# Patient Record
Sex: Male | Born: 1990 | Race: Black or African American | Hispanic: No | Marital: Single | Smoking: Current every day smoker
Health system: Southern US, Community
[De-identification: ages and names within clinical notes are randomized; demographics above are authoritative.]

## PROBLEM LIST (undated history)

## (undated) DIAGNOSIS — H3320 Serous retinal detachment, unspecified eye: Secondary | ICD-10-CM

## (undated) DIAGNOSIS — E669 Obesity, unspecified: Secondary | ICD-10-CM

## (undated) DIAGNOSIS — J45909 Unspecified asthma, uncomplicated: Secondary | ICD-10-CM

## (undated) DIAGNOSIS — F329 Major depressive disorder, single episode, unspecified: Secondary | ICD-10-CM

## (undated) DIAGNOSIS — J40 Bronchitis, not specified as acute or chronic: Secondary | ICD-10-CM

## (undated) DIAGNOSIS — B019 Varicella without complication: Secondary | ICD-10-CM

## (undated) DIAGNOSIS — T300 Burn of unspecified body region, unspecified degree: Secondary | ICD-10-CM

## (undated) DIAGNOSIS — F419 Anxiety disorder, unspecified: Secondary | ICD-10-CM

## (undated) DIAGNOSIS — F32A Depression, unspecified: Secondary | ICD-10-CM

## (undated) HISTORY — DX: Obesity, unspecified: E66.9

## (undated) HISTORY — DX: Serous retinal detachment, unspecified eye: H33.20

## (undated) HISTORY — DX: Anxiety disorder, unspecified: F41.9

## (undated) HISTORY — DX: Burn of unspecified body region, unspecified degree: T30.0

## (undated) HISTORY — DX: Major depressive disorder, single episode, unspecified: F32.9

## (undated) HISTORY — DX: Unspecified asthma, uncomplicated: J45.909

## (undated) HISTORY — DX: Depression, unspecified: F32.A

## (undated) HISTORY — DX: Varicella without complication: B01.9

---
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
## 2010-03-24 LAB — D-DIMER, QUANTITATIVE: D-Dimer, Quant: <0.22 ug/mL-FEU (ref 0.00–0.48)

## 2010-07-04 ENCOUNTER — Ambulatory Visit: Payer: Self-pay | Admitting: Medical

## 2010-08-04 ENCOUNTER — Encounter: Payer: Self-pay | Admitting: Medical

## 2010-08-04 ENCOUNTER — Encounter: Payer: Self-pay | Admitting: Family Medicine

## 2010-08-04 ENCOUNTER — Ambulatory Visit (INDEPENDENT_AMBULATORY_CARE_PROVIDER_SITE_OTHER): Payer: Federal, State, Local not specified - PPO | Admitting: Medical

## 2010-08-04 VITALS — BP 140/84 | HR 68 | Temp 98.1°F | Ht 72.0 in | Wt 206.0 lb

## 2010-08-04 DIAGNOSIS — F39 Unspecified mood [affective] disorder: Secondary | ICD-10-CM

## 2010-08-04 DIAGNOSIS — F329 Major depressive disorder, single episode, unspecified: Secondary | ICD-10-CM

## 2010-08-04 DIAGNOSIS — Z202 Contact with and (suspected) exposure to infections with a predominantly sexual mode of transmission: Secondary | ICD-10-CM

## 2010-08-04 DIAGNOSIS — R5383 Other fatigue: Secondary | ICD-10-CM

## 2010-08-04 DIAGNOSIS — R05 Cough: Secondary | ICD-10-CM

## 2010-08-04 LAB — CBC WITH DIFFERENTIAL/PLATELET
Eosinophils Relative: 1 % (ref 0–5)
HCT: 44.8 % (ref 39.0–52.0)
Hemoglobin: 15.1 g/dL (ref 13.0–17.0)
Lymphocytes Relative: 34 % (ref 12–46)
Lymphs Abs: 2.8 10*3/uL (ref 0.7–4.0)
MCV: 84.8 fL (ref 78.0–100.0)
Monocytes Absolute: 0.5 10*3/uL (ref 0.1–1.0)
Monocytes Relative: 6 % (ref 3–12)
Neutro Abs: 4.7 10*3/uL (ref 1.7–7.7)
WBC: 8.2 10*3/uL (ref 4.0–10.5)

## 2010-08-04 LAB — COMPREHENSIVE METABOLIC PANEL
AST: 18 U/L (ref 0–37)
Albumin: 4.2 g/dL (ref 3.5–5.2)
BUN: 10 mg/dL (ref 6–23)
CO2: 24 mEq/L (ref 19–32)
Calcium: 9.5 mg/dL (ref 8.4–10.5)
Chloride: 106 mEq/L (ref 96–112)
Glucose, Bld: 90 mg/dL (ref 70–99)
Potassium: 3.9 mEq/L (ref 3.5–5.3)

## 2010-08-04 MED ORDER — RISPERIDONE 0.5 MG PO TABS: 0.5000 mg | ORAL_TABLET | Freq: Two times a day (BID) | ORAL | Status: AC

## 2010-08-04 NOTE — Patient Instructions (Signed)
Recheck in 2 weeks.  Call sooner if needed.
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
## 2010-08-05 ENCOUNTER — Telehealth: Payer: Self-pay | Admitting: Medical

## 2010-08-05 LAB — GC/CHLAMYDIA PROBE AMP, URINE: GC Probe Amp, Urine: NEGATIVE

## 2010-08-05 NOTE — Telephone Encounter (Signed)
Pt has called twice and would like results.  Please review labs and let me know.  CM, LPN

## 2010-08-05 NOTE — Telephone Encounter (Signed)
All labs are normal. 

## 2010-08-05 NOTE — Telephone Encounter (Signed)
Pt notified that all lab results were normal.  Faxed GC and Chlamydia results to health department in [facility] to [facility].  CM, LPN

## 2010-08-11 ENCOUNTER — Telehealth: Payer: Self-pay | Admitting: *Deleted

## 2010-08-11 NOTE — Telephone Encounter (Addendum)
Message copied by Dorthula Perfect on Thu Aug 11, 2010  5:02 PM ------      Message from: Aleen Campi, DAVID S      Created: Thu Aug 11, 2010 12:45 PM       Liver, kidney, electrolytes, blood counts, thyroid labs ALL normal.   His STD screening ALL normal for HIV, syphilis, gonorrhea, chlamydia.  Pls send copy of the STD labs to health dept in [place] - see chart notes for address/contact info.  He left a business card with this info too.  Have him return in 2wk f/u on medication as discussed.    Pt informed of all labs and have faxed STD screening to Health Depart. In [place].  Pt scheduled to come in on 08-17-10 at 10am.  CM,LPN

## 2010-08-17 ENCOUNTER — Ambulatory Visit: Payer: Federal, State, Local not specified - PPO | Admitting: Medical

## 2010-10-14 ENCOUNTER — Other Ambulatory Visit (INDEPENDENT_AMBULATORY_CARE_PROVIDER_SITE_OTHER): Payer: Federal, State, Local not specified - PPO

## 2010-10-14 DIAGNOSIS — Z23 Encounter for immunization: Secondary | ICD-10-CM

## 2010-11-03 ENCOUNTER — Ambulatory Visit: Payer: Federal, State, Local not specified - PPO | Admitting: Family Medicine

## 2010-11-04 ENCOUNTER — Emergency Department (HOSPITAL_COMMUNITY)
Admission: EM | Admit: 2010-11-04 | Discharge: 2010-11-05 | Disposition: A | Discharge status: Home / Self Care | Payer: Federal, State, Local not specified - PPO | Attending: Emergency Medicine | Admitting: Emergency Medicine

## 2010-11-04 DIAGNOSIS — R05 Cough: Secondary | ICD-10-CM | POA: Insufficient documentation

## 2010-11-04 DIAGNOSIS — R059 Cough, unspecified: Secondary | ICD-10-CM | POA: Insufficient documentation

## 2010-11-04 DIAGNOSIS — J020 Streptococcal pharyngitis: Secondary | ICD-10-CM | POA: Insufficient documentation

## 2010-11-04 DIAGNOSIS — F17200 Nicotine dependence, unspecified, uncomplicated: Secondary | ICD-10-CM | POA: Insufficient documentation

## 2010-11-04 DIAGNOSIS — R6883 Chills (without fever): Secondary | ICD-10-CM | POA: Insufficient documentation

## 2011-02-22 ENCOUNTER — Ambulatory Visit (INDEPENDENT_AMBULATORY_CARE_PROVIDER_SITE_OTHER): Payer: Federal, State, Local not specified - PPO | Admitting: Family Medicine

## 2011-02-22 ENCOUNTER — Encounter: Payer: Self-pay | Admitting: Family Medicine

## 2011-02-22 VITALS — BP 102/70 | HR 80 | Ht 72.0 in | Wt 224.0 lb

## 2011-02-22 DIAGNOSIS — L0100 Impetigo, unspecified: Secondary | ICD-10-CM

## 2011-02-22 MED ORDER — DOXYCYCLINE HYCLATE 100 MG PO TABS: 100.0000 mg | ORAL_TABLET | Freq: Two times a day (BID) | ORAL | Status: AC

## 2011-02-22 NOTE — Progress Notes (Signed)
  Subjective:    Patient ID: Dustin Day, male    DOB: 07-26-1990, 21 y.o.   MRN: [id number]  HPI He is here for evaluation of multiple scattered skin lesions. In the past he had apparently been seen for this and treated with an antibiotic however he ran out of this medication.   Review of Systems     Objective:   Physical Exam Exam of his skin shows scattered healing lesions mainly on his arms. They're very superficial and slightly scaly.       Assessment & Plan:   1. Impetigo any site    I will place him on doxycycline. He is also to use Dial soap or Lever 2000. He will call if he has further trouble

## 2011-02-22 NOTE — Patient Instructions (Signed)
Take all the antibiotic and have none left over. use Dial soap or Lever 2000

## 2011-03-22 ENCOUNTER — Ambulatory Visit (INDEPENDENT_AMBULATORY_CARE_PROVIDER_SITE_OTHER): Payer: Federal, State, Local not specified - PPO | Admitting: Medical

## 2011-03-22 ENCOUNTER — Encounter: Payer: Self-pay | Admitting: Medical

## 2011-03-22 VITALS — BP 110/70 | HR 60 | Temp 98.1°F | Resp 16 | Wt 222.0 lb

## 2011-03-22 DIAGNOSIS — Q809 Congenital ichthyosis, unspecified: Secondary | ICD-10-CM

## 2011-03-22 DIAGNOSIS — Q828 Other specified congenital malformations of skin: Secondary | ICD-10-CM

## 2011-03-22 DIAGNOSIS — L209 Atopic dermatitis, unspecified: Secondary | ICD-10-CM

## 2011-03-22 DIAGNOSIS — L2089 Other atopic dermatitis: Secondary | ICD-10-CM

## 2011-03-22 MED ORDER — TRIAMCINOLONE ACETONIDE 0.1 % EX CREA: TOPICAL_CREAM | Freq: Two times a day (BID) | CUTANEOUS | Status: DC
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
## 2011-03-22 NOTE — Patient Instructions (Signed)
Your skin today suggests eczema flare up.     I want you to keep your skin moist in general with drinking plenty of water, using a good moisturizing lotion once to twice daily at least, with Eucerin or Lubriderm.  For the next few days, in addition to moisturizing lotion, begin the Triamcinolone cream to the itchy rough areas that are the worst.   Also, since eczema is related to allergies, and you have a history of allergies in the spring, go ahead and begin back on Zyrtec 10mg  daily OTC for the next 1- 2 months.   Other measures - avoid really hot showers, consider using Dove Sensitive Skin soap, you can also do some oatmeal baths.   Eczema Atopic dermatitis, or eczema, is an inherited type of sensitive skin. Often people with eczema have a family history of allergies, asthma, or hay fever. It causes a red itchy rash and dry scaly skin. The itchiness may occur before the skin rash and may be very intense. It is not contagious. Eczema is generally worse during the cooler winter months and often improves with the warmth of summer. Eczema usually starts showing signs in infancy. Some children outgrow eczema, but it may last through adulthood. Flare-ups may be caused by:  Eating something or contact with something you are sensitive or allergic to.   Stress.  DIAGNOSIS  The diagnosis of eczema is usually based upon symptoms and medical history. TREATMENT  Eczema cannot be cured, but symptoms usually can be controlled with treatment or avoidance of allergens (things to which you are sensitive or allergic to).  Controlling the itching and scratching.   Use over-the-counter antihistamines as directed for itching. It is especially useful at night when the itching tends to be worse.   Use over-the-counter steroid creams as directed for itching.   Scratching makes the rash and itching worse and may cause impetigo (a skin infection) if fingernails are contaminated (dirty).   Keeping the skin well  moisturized with creams every day. This will seal in moisture and help prevent dryness. Lotions containing alcohol and water can dry the skin and are not recommended.   Limiting exposure to allergens.   Recognizing situations that cause stress.   Developing a plan to manage stress.  HOME CARE INSTRUCTIONS   Take prescription and over-the-counter medicines as directed by your caregiver.   Do not use anything on the skin without checking with your caregiver.   Keep baths or showers short (5 minutes) in warm (not hot) water. Use mild cleansers for bathing. You may add non-perfumed bath oil to the bath water. It is best to avoid soap and bubble bath.   Immediately after a bath or shower, when the skin is still damp, apply a moisturizing ointment to the entire body. This ointment should be a petroleum ointment. This will seal in moisture and help prevent dryness. The thicker the ointment the better. These should be unscented.   Keep fingernails cut short and wash hands often. If your child has eczema, it may be necessary to put soft gloves or mittens on your child at night.   Dress in clothes made of cotton or cotton blends. Dress lightly, as heat increases itching.   Avoid foods that may cause flare-ups. Common foods include cow's milk, peanut butter, eggs and wheat.   Keep a child with eczema away from anyone with fever blisters. The virus that causes fever blisters (herpes simplex) can cause a serious skin infection in children with eczema.  SEEK MEDICAL CARE IF:   Itching interferes with sleep.   The rash gets worse or is not better within one week following treatment.   The rash looks infected (pus or soft yellow scabs).   You or your child has an oral temperature above 102 F (38.9 C).   Your baby is older than 3 months with a rectal temperature of 100.5 F (38.1 C) or higher for more than 1 day.   The rash flares up after contact with someone who has fever blisters.  SEEK  IMMEDIATE MEDICAL CARE IF:   Your baby is older than 3 months with a rectal temperature of 102 F (38.9 C) or higher.   Your baby is older than 3 months or younger with a rectal temperature of 100.4 F (38 C) or higher.  Document Released: 12/24/1999 Document Revised: 12/15/2010 Document Reviewed: 10/28/2008 [facility] Patient Information 2012 [place], [place].

## 2011-10-11 ENCOUNTER — Encounter: Payer: Self-pay | Admitting: Internal Medicine

## 2011-10-19 ENCOUNTER — Encounter: Payer: Self-pay | Admitting: Family Medicine

## 2011-10-19 ENCOUNTER — Ambulatory Visit (INDEPENDENT_AMBULATORY_CARE_PROVIDER_SITE_OTHER): Payer: Federal, State, Local not specified - PPO | Admitting: Family Medicine

## 2011-10-19 VITALS — BP 130/80 | HR 102

## 2011-10-19 DIAGNOSIS — Z0000 Encounter for general adult medical examination without abnormal findings: Secondary | ICD-10-CM

## 2011-10-19 DIAGNOSIS — Z209 Contact with and (suspected) exposure to unspecified communicable disease: Secondary | ICD-10-CM

## 2011-10-19 DIAGNOSIS — Z23 Encounter for immunization: Secondary | ICD-10-CM
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
## 2011-10-20 LAB — GC/CHLAMYDIA PROBE AMP, URINE
Chlamydia, Swab/Urine, PCR: NEGATIVE
GC Probe Amp, Urine: NEGATIVE

## 2011-10-20 LAB — RPR

## 2011-10-20 NOTE — Progress Notes (Signed)
Quick Note:  Pt called and and message left labs look great all negative ______

## 2012-03-13 ENCOUNTER — Encounter: Payer: Self-pay | Admitting: Family Medicine

## 2012-03-13 ENCOUNTER — Ambulatory Visit (INDEPENDENT_AMBULATORY_CARE_PROVIDER_SITE_OTHER): Payer: Federal, State, Local not specified - PPO | Admitting: Family Medicine

## 2012-03-13 VITALS — BP 130/88 | HR 101 | Wt 228.0 lb

## 2012-03-13 DIAGNOSIS — L678 Other hair color and hair shaft abnormalities: Secondary | ICD-10-CM

## 2012-03-13 DIAGNOSIS — Z209 Contact with and (suspected) exposure to unspecified communicable disease: Secondary | ICD-10-CM

## 2012-03-13 DIAGNOSIS — L738 Other specified follicular disorders: Secondary | ICD-10-CM

## 2012-03-13 DIAGNOSIS — L739 Follicular disorder, unspecified: Secondary | ICD-10-CM

## 2012-03-13 MED ORDER — DOXYCYCLINE HYCLATE 100 MG PO TABS: 100.0000 mg | ORAL_TABLET | Freq: Two times a day (BID) | ORAL | Status: DC

## 2012-03-13 MED ORDER — TRIAMCINOLONE ACETONIDE 0.1 % EX CREA: TOPICAL_CREAM | Freq: Two times a day (BID) | CUTANEOUS | Status: DC
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
## 2012-03-14 LAB — GC/CHLAMYDIA PROBE AMP, URINE: GC Probe Amp, Urine: NEGATIVE

## 2012-03-14 NOTE — Progress Notes (Signed)
Quick Note:  Called pt to inform him labs are neg left message ______

## 2012-03-16 LAB — CULTURE, ROUTINE-ABSCESS

## 2012-03-30 ENCOUNTER — Emergency Department (HOSPITAL_COMMUNITY)
Admission: EM | Admit: 2012-03-30 | Discharge: 2012-03-30 | Disposition: A | Discharge status: Home / Self Care | Payer: Federal, State, Local not specified - PPO | Attending: Emergency Medicine | Admitting: Emergency Medicine

## 2012-03-30 ENCOUNTER — Emergency Department (HOSPITAL_COMMUNITY): Payer: Federal, State, Local not specified - PPO

## 2012-03-30 ENCOUNTER — Encounter (HOSPITAL_COMMUNITY): Payer: Self-pay | Admitting: *Deleted

## 2012-03-30 DIAGNOSIS — Z87828 Personal history of other (healed) physical injury and trauma: Secondary | ICD-10-CM | POA: Insufficient documentation

## 2012-03-30 DIAGNOSIS — Z8614 Personal history of Methicillin resistant Staphylococcus aureus infection: Secondary | ICD-10-CM | POA: Insufficient documentation

## 2012-03-30 DIAGNOSIS — Z8669 Personal history of other diseases of the nervous system and sense organs: Secondary | ICD-10-CM | POA: Insufficient documentation

## 2012-03-30 DIAGNOSIS — E669 Obesity, unspecified: Secondary | ICD-10-CM | POA: Insufficient documentation

## 2012-03-30 DIAGNOSIS — R079 Chest pain, unspecified: Secondary | ICD-10-CM | POA: Insufficient documentation

## 2012-03-30 DIAGNOSIS — IMO0002 Reserved for concepts with insufficient information to code with codable children: Secondary | ICD-10-CM | POA: Insufficient documentation

## 2012-03-30 DIAGNOSIS — R6883 Chills (without fever): Secondary | ICD-10-CM | POA: Insufficient documentation

## 2012-03-30 DIAGNOSIS — R0602 Shortness of breath: Secondary | ICD-10-CM | POA: Insufficient documentation

## 2012-03-30 DIAGNOSIS — R197 Diarrhea, unspecified: Secondary | ICD-10-CM | POA: Insufficient documentation

## 2012-03-30 DIAGNOSIS — IMO0001 Reserved for inherently not codable concepts without codable children: Secondary | ICD-10-CM | POA: Insufficient documentation

## 2012-03-30 DIAGNOSIS — J029 Acute pharyngitis, unspecified: Secondary | ICD-10-CM | POA: Insufficient documentation

## 2012-03-30 DIAGNOSIS — R0989 Other specified symptoms and signs involving the circulatory and respiratory systems: Secondary | ICD-10-CM | POA: Insufficient documentation

## 2012-03-30 DIAGNOSIS — F17200 Nicotine dependence, unspecified, uncomplicated: Secondary | ICD-10-CM | POA: Insufficient documentation

## 2012-03-30 DIAGNOSIS — J40 Bronchitis, not specified as acute or chronic: Secondary | ICD-10-CM | POA: Insufficient documentation

## 2012-03-30 DIAGNOSIS — J3489 Other specified disorders of nose and nasal sinuses: Secondary | ICD-10-CM | POA: Insufficient documentation

## 2012-03-30 DIAGNOSIS — R61 Generalized hyperhidrosis: Secondary | ICD-10-CM | POA: Insufficient documentation

## 2012-03-30 HISTORY — DX: Bronchitis, not specified as acute or chronic: J40

## 2012-03-30 MED ORDER — BENZONATATE 100 MG PO CAPS: 100.0000 mg | ORAL_CAPSULE | Freq: Three times a day (TID) | ORAL | Status: DC

## 2012-03-30 MED ORDER — ALBUTEROL SULFATE HFA 108 (90 BASE) MCG/ACT IN AERS
2.0000 | INHALATION_SPRAY | Freq: Once | RESPIRATORY_TRACT | Status: AC
  Administered 2012-03-30: 2 via RESPIRATORY_TRACT
  Filled 2012-03-30: qty 6.7
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
## 2012-03-30 NOTE — ED Notes (Addendum)
Pt states he has had a cold/cough and nasal congestion x2 weeks.  Pt reports subjective fever.  Pt endorses occasional diarrhea over past 2 weeks, but denies N/V.  Pt states this morning he developed chest pain with inspiration/expiration and movement.  Pt's lung sounds are clear.

## 2012-04-01 NOTE — ED Provider Notes (Signed)
Medical screening examination/treatment/procedure(s) were performed by non-physician practitioner and as supervising physician I was immediately available for consultation/collaboration.   Daisuke Bailey L Gioia Ranes, MD 04/01/12 0554 

## 2012-06-23 ENCOUNTER — Emergency Department (HOSPITAL_COMMUNITY)
Admission: EM | Admit: 2012-06-23 | Discharge: 2012-06-23 | Disposition: A | Discharge status: Home / Self Care | Payer: Federal, State, Local not specified - PPO | Attending: Emergency Medicine | Admitting: Emergency Medicine

## 2012-06-23 ENCOUNTER — Encounter (HOSPITAL_COMMUNITY): Payer: Self-pay | Admitting: *Deleted

## 2012-06-23 ENCOUNTER — Emergency Department (HOSPITAL_COMMUNITY): Payer: Federal, State, Local not specified - PPO

## 2012-06-23 DIAGNOSIS — Z8709 Personal history of other diseases of the respiratory system: Secondary | ICD-10-CM | POA: Insufficient documentation

## 2012-06-23 DIAGNOSIS — X500XXA Overexertion from strenuous movement or load, initial encounter: Secondary | ICD-10-CM | POA: Insufficient documentation

## 2012-06-23 DIAGNOSIS — Y9389 Activity, other specified: Secondary | ICD-10-CM | POA: Insufficient documentation

## 2012-06-23 DIAGNOSIS — Z87828 Personal history of other (healed) physical injury and trauma: Secondary | ICD-10-CM | POA: Insufficient documentation

## 2012-06-23 DIAGNOSIS — E669 Obesity, unspecified: Secondary | ICD-10-CM | POA: Insufficient documentation

## 2012-06-23 DIAGNOSIS — S93401A Sprain of unspecified ligament of right ankle, initial encounter: Secondary | ICD-10-CM

## 2012-06-23 DIAGNOSIS — F17200 Nicotine dependence, unspecified, uncomplicated: Secondary | ICD-10-CM | POA: Insufficient documentation

## 2012-06-23 DIAGNOSIS — S93601A Unspecified sprain of right foot, initial encounter: Secondary | ICD-10-CM

## 2012-06-23 DIAGNOSIS — S93409A Sprain of unspecified ligament of unspecified ankle, initial encounter: Secondary | ICD-10-CM | POA: Insufficient documentation

## 2012-06-23 DIAGNOSIS — Z8669 Personal history of other diseases of the nervous system and sense organs: Secondary | ICD-10-CM | POA: Insufficient documentation

## 2012-06-23 DIAGNOSIS — Y9289 Other specified places as the place of occurrence of the external cause: Secondary | ICD-10-CM | POA: Insufficient documentation

## 2012-06-23 MED ORDER — OXYCODONE-ACETAMINOPHEN 5-325 MG PO TABS
1.0000 | ORAL_TABLET | Freq: Once | ORAL | Status: AC
  Administered 2012-06-23: 1 via ORAL
  Filled 2012-06-23: qty 1

## 2012-06-23 MED ORDER — OXYCODONE-ACETAMINOPHEN 5-325 MG PO TABS: 1.0000 | ORAL_TABLET | ORAL | Status: DC | PRN

## 2012-06-23 MED ORDER — IBUPROFEN 800 MG PO TABS
800.0000 mg | ORAL_TABLET | Freq: Once | ORAL | Status: AC
  Administered 2012-06-23: 800 mg via ORAL
  Filled 2012-06-23: qty 1

## 2012-06-23 MED ORDER — IBUPROFEN 800 MG PO TABS: 800.0000 mg | ORAL_TABLET | Freq: Three times a day (TID) | ORAL | Status: DC

## 2012-06-23 NOTE — Progress Notes (Signed)
Orthopedic Tech Progress Note Patient Details:  Dustin Day 04-Apr-1990 [id number]  Ortho Devices Type of Ortho Device: ASO;Crutches Ortho Device/Splint Location: RLE Ortho Device/Splint Interventions: Ordered;Application   Jennye Moccasin 06/23/2012, 11:02 PM
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
## 2012-06-23 NOTE — Discharge Instructions (Signed)
Ankle Sprain °An ankle sprain is an injury to the strong, fibrous tissues (ligaments) that hold the bones of your ankle joint together.  °CAUSES °An ankle sprain is usually caused by a fall or by twisting your ankle. Ankle sprains most commonly occur when you step on the outer edge of your foot, and your ankle turns inward. People who participate in sports are more prone to these types of injuries.  °SYMPTOMS  °· Pain in your ankle. The pain may be present at rest or only when you are trying to stand or walk. °· Swelling. °· Bruising. Bruising may develop immediately or within 1 to 2 days after your injury. °· Difficulty standing or walking, particularly when turning corners or changing directions. °DIAGNOSIS  °Your caregiver will ask you details about your injury and perform a physical exam of your ankle to determine if you have an ankle sprain. During the physical exam, your caregiver will press on and apply pressure to specific areas of your foot and ankle. Your caregiver will try to move your ankle in certain ways. An X-ray exam may be done to be sure a bone was not broken or a ligament did not separate from one of the bones in your ankle (avulsion fracture).  °TREATMENT  °Certain types of braces can help stabilize your ankle. Your caregiver can make a recommendation for this. Your caregiver may recommend the use of medicine for pain. If your sprain is severe, your caregiver may refer you to a surgeon who helps to restore function to parts of your skeletal system (orthopedist) or a physical therapist. °HOME CARE INSTRUCTIONS  °· Apply ice to your injury for 1 2 days or as directed by your caregiver. Applying ice helps to reduce inflammation and pain. °· Put ice in a plastic bag. °· Place a towel between your skin and the bag. °· Leave the ice on for 15-20 minutes at a time, every 2 hours while you are awake. °· Only take over-the-counter or prescription medicines for pain, discomfort, or fever as directed by  your caregiver. °· Elevate your injured ankle above the level of your heart as much as possible for 2 3 days. °· If your caregiver recommends crutches, use them as instructed. Gradually put weight on the affected ankle. Continue to use crutches or a cane until you can walk without feeling pain in your ankle. °· If you have a plaster splint, wear the splint as directed by your caregiver. Do not rest it on anything harder than a pillow for the first 24 hours. Do not put weight on it. Do not get it wet. You may take it off to take a shower or bath. °· You may have been given an elastic bandage to wear around your ankle to provide support. If the elastic bandage is too tight (you have numbness or tingling in your foot or your foot becomes cold and blue), adjust the bandage to make it comfortable. °· If you have an air splint, you may blow more air into it or let air out to make it more comfortable. You may take your splint off at night and before taking a shower or bath. Wiggle your toes in the splint several times per day to decrease swelling. °SEEK MEDICAL CARE IF:  °· You have rapidly increasing bruising or swelling. °· Your toes feel extremely cold or you lose feeling in your foot. °· Your pain is not relieved with medicine. °SEEK IMMEDIATE MEDICAL CARE IF: °· Your toes are numb   or blue. °· You have severe pain that is increasing. °MAKE SURE YOU:  °· Understand these instructions. °· Will watch your condition. °· Will get help right away if you are not doing well or get worse. °Document Released: 12/26/2004 Document Revised: 09/20/2011 Document Reviewed: 01/07/2011 °[employer]® Patient Information ©2014 [employer]. ° °Cryotherapy °Cryotherapy means treatment with cold. Ice or gel packs can be used to reduce both pain and swelling. Ice is the most helpful within the first 24 to 48 hours after an injury or flareup from overusing a muscle or joint. Sprains, strains, spasms, burning pain, shooting pain, and aches can  all be eased with ice. Ice can also be used when recovering from surgery. Ice is effective, has very few side effects, and is safe for most people to use. °PRECAUTIONS  °Ice is not a safe treatment option for people with: °· Raynaud's phenomenon. This is a condition affecting small blood vessels in the extremities. Exposure to cold may cause your problems to return. °· Cold hypersensitivity. There are many forms of cold hypersensitivity, including: °· Cold urticaria. Red, itchy hives appear on the skin when the tissues begin to warm after being iced. °· Cold erythema. This is a red, itchy rash caused by exposure to cold. °· Cold hemoglobinuria. Red blood cells break down when the tissues begin to warm after being iced. The hemoglobin that carry oxygen are passed into the urine because they cannot combine with blood proteins fast enough. °· Numbness or altered sensitivity in the area being iced. °If you have any of the following conditions, do not use ice until you have discussed cryotherapy with your caregiver: °· Heart conditions, such as arrhythmia, angina, or chronic heart disease. °· High blood pressure. °· Healing wounds or open skin in the area being iced. °· Current infections. °· Rheumatoid arthritis. °· Poor circulation. °· Diabetes. °Ice slows the blood flow in the region it is applied. This is beneficial when trying to stop inflamed tissues from spreading irritating chemicals to surrounding tissues. However, if you expose your skin to cold temperatures for too long or without the proper protection, you can damage your skin or nerves. Watch for signs of skin damage due to cold. °HOME CARE INSTRUCTIONS °Follow these tips to use ice and cold packs safely. °· Place a dry or damp towel between the ice and skin. A damp towel will cool the skin more quickly, so you may need to shorten the time that the ice is used. °· For a more rapid response, add gentle compression to the ice. °· Ice for no more than 10 to 20  minutes at a time. The bonier the area you are icing, the less time it will take to get the benefits of ice. °· Check your skin after 5 minutes to make sure there are no signs of a poor response to cold or skin damage. °· Rest 20 minutes or more in between uses. °· Once your skin is numb, you can end your treatment. You can test numbness by very lightly touching your skin. The touch should be so light that you do not see the skin dimple from the pressure of your fingertip. When using ice, most people will feel these normal sensations in this order: cold, burning, aching, and numbness. °· Do not use ice on someone who cannot communicate their responses to pain, such as small children or people with dementia. °HOW TO MAKE AN ICE PACK °Ice packs are the most common way to use ice   therapy. Other methods include ice massage, ice baths, and cryo-sprays. Muscle creams that cause a cold, tingly feeling do not offer the same benefits that ice offers and should not be used as a substitute unless recommended by your caregiver. To make an ice pack, do one of the following:  Place crushed ice or a bag of frozen vegetables in a sealable plastic bag. Squeeze out the excess air. Place this bag inside another plastic bag. Slide the bag into a pillowcase or place a damp towel between your skin and the bag.  Mix 3 parts water with 1 part rubbing alcohol. Freeze the mixture in a sealable plastic bag. When you remove the mixture from the freezer, it will be slushy. Squeeze out the excess air. Place this bag inside another plastic bag. Slide the bag into a pillowcase or place a damp towel between your skin and the bag. SEEK MEDICAL CARE IF:  You develop white spots on your skin. This may give the skin a blotchy (mottled) appearance.  Your skin turns blue or pale.  Your skin becomes waxy or hard.  Your swelling gets worse. MAKE SURE YOU:   Understand these instructions.  Will watch your condition.  Will get help right  away if you are not doing well or get worse. Document Released: 08/22/2010 Document Revised: 03/20/2011 Document Reviewed: 08/22/2010 [facility] Patient Information 2014 [place], [place]. Foot Sprain The muscles and cord like structures which attach muscle to bone (tendons) that surround the feet are made up of units. A foot sprain can occur at the weakest spot in any of these units. This condition is most often caused by injury to or overuse of the foot, as from playing contact sports, or aggravating a previous injury, or from poor conditioning, or obesity. SYMPTOMS  Pain with movement of the foot.  Tenderness and swelling at the injury site.  Loss of strength is present in moderate or severe sprains. THE THREE GRADES OR SEVERITY OF FOOT SPRAIN ARE:  Mild (Grade I): Slightly pulled muscle without tearing of muscle or tendon fibers or loss of strength.  Moderate (Grade II): Tearing of fibers in a muscle, tendon, or at the attachment to bone, with small decrease in strength.  Severe (Grade III): Rupture of the muscle-tendon-bone attachment, with separation of fibers. Severe sprain requires surgical repair. Often repeating (chronic) sprains are caused by overuse. Sudden (acute) sprains are caused by direct injury or over-use. DIAGNOSIS  Diagnosis of this condition is usually by your own observation. If problems continue, a caregiver may be required for further evaluation and treatment. X-rays may be required to make sure there are not breaks in the bones (fractures) present. Continued problems may require physical therapy for treatment. PREVENTION  Use strength and conditioning exercises appropriate for your sport.  Warm up properly prior to working out.  Use athletic shoes that are made for the sport you are participating in.  Allow adequate time for healing. Early return to activities makes repeat injury more likely, and can lead to an unstable arthritic foot that can result in prolonged  disability. Mild sprains generally heal in 3 to 10 days, with moderate and severe sprains taking 2 to 10 weeks. Your caregiver can help you determine the proper time required for healing. HOME CARE INSTRUCTIONS   Apply ice to the injury for 15-20 minutes, 3-4 times per day. Put the ice in a plastic bag and place a towel between the bag of ice and your skin.  An elastic wrap (like  an Ace bandage) may be used to keep swelling down.  Keep foot above the level of the heart, or at least raised on a footstool, when swelling and pain are present.  Try to avoid use other than gentle range of motion while the foot is painful. Do not resume use until instructed by your caregiver. Then begin use gradually, not increasing use to the point of pain. If pain does develop, decrease use and continue the above measures, gradually increasing activities that do not cause discomfort, until you gradually achieve normal use.  Use crutches if and as instructed, and for the length of time instructed.  Keep injured foot and ankle wrapped between treatments.  Massage foot and ankle for comfort and to keep swelling down. Massage from the toes up towards the knee.  Only take over-the-counter or prescription medicines for pain, discomfort, or fever as directed by your caregiver. SEEK IMMEDIATE MEDICAL CARE IF:   Your pain and swelling increase, or pain is not controlled with medications.  You have loss of feeling in your foot or your foot turns cold or blue.  You develop new, unexplained symptoms, or an increase of the symptoms that brought you to your caregiver. MAKE SURE YOU:   Understand these instructions.  Will watch your condition.  Will get help right away if you are not doing well or get worse. Document Released: 06/17/2001 Document Revised: 03/20/2011 Document Reviewed: 08/15/2007 [facility] Patient Information 2014 [place], [place].

## 2012-06-24 NOTE — ED Provider Notes (Signed)
Medical screening examination/treatment/procedure(s) were performed by non-physician practitioner and as supervising physician I was immediately available for consultation/collaboration.  Derwood Kaplan, MD 06/24/12 0025

## 2012-11-14 ENCOUNTER — Other Ambulatory Visit: Payer: Self-pay

## 2013-03-22 ENCOUNTER — Emergency Department (HOSPITAL_COMMUNITY)
Admission: EM | Admit: 2013-03-22 | Discharge: 2013-03-22 | Disposition: A | Discharge status: Home / Self Care | Payer: Federal, State, Local not specified - PPO | Attending: Emergency Medicine | Admitting: Emergency Medicine

## 2013-03-22 ENCOUNTER — Encounter (HOSPITAL_COMMUNITY): Payer: Self-pay | Admitting: Emergency Medicine

## 2013-03-22 DIAGNOSIS — Z87828 Personal history of other (healed) physical injury and trauma: Secondary | ICD-10-CM | POA: Insufficient documentation

## 2013-03-22 DIAGNOSIS — F17200 Nicotine dependence, unspecified, uncomplicated: Secondary | ICD-10-CM | POA: Insufficient documentation

## 2013-03-22 DIAGNOSIS — J029 Acute pharyngitis, unspecified: Secondary | ICD-10-CM | POA: Insufficient documentation

## 2013-03-22 DIAGNOSIS — Z791 Long term (current) use of non-steroidal anti-inflammatories (NSAID): Secondary | ICD-10-CM | POA: Insufficient documentation

## 2013-03-22 DIAGNOSIS — J36 Peritonsillar abscess: Secondary | ICD-10-CM

## 2013-03-22 DIAGNOSIS — E669 Obesity, unspecified: Secondary | ICD-10-CM | POA: Insufficient documentation

## 2013-03-22 MED ORDER — HYDROCODONE-ACETAMINOPHEN 5-325 MG PO TABS: 1.0000 | ORAL_TABLET | ORAL | Status: DC | PRN

## 2013-03-22 MED ORDER — DEXAMETHASONE SODIUM PHOSPHATE 10 MG/ML IJ SOLN
10.0000 mg | Freq: Once | INTRAMUSCULAR | Status: AC
  Administered 2013-03-22: 10 mg via INTRAVENOUS
  Filled 2013-03-22: qty 1

## 2013-03-22 MED ORDER — MORPHINE SULFATE 4 MG/ML IJ SOLN
4.0000 mg | Freq: Once | INTRAMUSCULAR | Status: AC
  Administered 2013-03-22: 4 mg via INTRAVENOUS
  Filled 2013-03-22: qty 1

## 2013-03-22 MED ORDER — CLINDAMYCIN HCL 300 MG PO CAPS: 300.0000 mg | ORAL_CAPSULE | Freq: Four times a day (QID) | ORAL | Status: DC

## 2013-03-22 MED ORDER — SODIUM CHLORIDE 0.9 % IV BOLUS (SEPSIS)
1000.0000 mL | Freq: Once | INTRAVENOUS | Status: AC
  Administered 2013-03-22: 1000 mL via INTRAVENOUS

## 2013-03-22 MED ORDER — OXYCODONE-ACETAMINOPHEN 5-325 MG PO TABS: 1.0000 | ORAL_TABLET | Freq: Four times a day (QID) | ORAL | Status: DC | PRN

## 2013-03-22 MED ORDER — CLINDAMYCIN PHOSPHATE 600 MG/50ML IV SOLN
600.0000 mg | Freq: Once | INTRAVENOUS | Status: AC
  Administered 2013-03-22: 600 mg via INTRAVENOUS
  Filled 2013-03-22: qty 50
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
## 2013-03-22 NOTE — Discharge Instructions (Signed)
Peritonsillar Abscess A peritonsillar abscess is a collection of pus located in the back of the throat behind the tonsils. It usually occurs when a streptococcal infection of the throat or tonsils spreads into the space around the tonsils. They are almost always caused by the streptococcal germ (bacteria). The treatment of a peritonsillar abscess is most often drainage accomplished by putting a needle into the abscess or cutting (incising) and draining the abscess. This is most often followed with a course of antibiotics. HOME CARE INSTRUCTIONS  If your abscess was drained by your caregiver today, rinse your throat (gargle) with warm salt water four times per day or as needed for comfort. Do not swallow this mixture. Mix 1 teaspoon of salt in 8 ounces of warm water for gargling.  Rest in bed as needed. Resume activities as able.  Apply cold to your neck for pain relief. Fill a plastic bag with ice and wrap it in a towel. Hold the ice on your neck for 20 minutes 4 times per day.  Eat a soft or liquid diet as tolerated while your throat remains sore. Popsicles and ice cream may be good early choices. Drinking plenty of cold fluids will probably be soothing and help take swelling down in between the warm gargles.  Only take over-the-counter or prescription medicines for pain, discomfort, or fever as directed by your caregiver. Do not use aspirin unless directed by your physician. Aspirin slows down the clotting process. It can also cause bleeding from the drainage area if this was needled or incised today.  If antibiotics were prescribed, take them as directed for the full course of the prescription. Even if you feel you are well, you need to take them. SEEK MEDICAL CARE IF:   You have increased pain, swelling, redness, or drainage in your throat.  You develop signs of infection such as dizziness, headache, lethargy, or generalized feelings of illness.  You have difficulty breathing, swallowing or  eating.  You show signs of becoming dehydrated (lightheadedness when standing, decreased urine output, a fast heart rate, or dry mouth and mucous membranes). SEEK IMMEDIATE MEDICAL CARE IF:   You have a fever.  You are coughing up or vomiting blood.  You develop more severe throat pain uncontrolled with medicines or you start to drool.  You develop difficulty breathing, talking, or find it easier to breathe while leaning forward. Document Released: 12/26/2004 Document Revised: 03/20/2011 Document Reviewed: 08/09/2007 [employer] Patient Information 2014 [employer].  

## 2013-03-22 NOTE — ED Notes (Signed)
Assisted Dr Zachery DauerBarnes in I&D of tonsillar abscess. Pt airway patent, A&O and in NAD

## 2013-03-22 NOTE — Procedures (Signed)
Preop diagnosis: Right peritonsillar abscess Postop diagnosis: Right peritonsillar cellulitis Procedure: Incision and drainage of right peritonsillar abscess Surgeon: Jenne PaneBates Anesth: Local with 2% lidocaine with 1:200,000 epinephrine Comp: None Findings: Right peritonsillar fullness and more of a soft edema.  No pus pocket encountered. Description: After discussing risks, benefits, and alternatives, the right oropharynx was sprayed with cetacaine spray a couple of times and then the area was injected with local anesthetic.  A horizontal incision was made above the right tonsil using an 11 blade scalpel.  A tonsil clamp was used to dissect deeply into the area.  A 22 gauge needle on a 10 cc syringe was used to aspirate the area in three directions and no pus was encountered.  He was then returned to nursing care in stable condition.

## 2013-03-22 NOTE — ED Notes (Signed)
Pt from home reports URI s/sx x1 week and R ear ache since last pm. Pt denies N/V/D. Pt is A&O and in NAD
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
## 2013-03-22 NOTE — ED Notes (Signed)
He c/o congested cough, plus right earache.  He is healthy-looking and in no distress.

## 2013-06-12 ENCOUNTER — Encounter (HOSPITAL_COMMUNITY): Payer: Self-pay | Admitting: Emergency Medicine

## 2013-06-12 ENCOUNTER — Emergency Department (HOSPITAL_COMMUNITY): Payer: Federal, State, Local not specified - PPO

## 2013-06-12 ENCOUNTER — Emergency Department (HOSPITAL_COMMUNITY)
Admission: EM | Admit: 2013-06-12 | Discharge: 2013-06-13 | Disposition: A | Discharge status: Home / Self Care | Payer: Federal, State, Local not specified - PPO | Attending: Emergency Medicine | Admitting: Emergency Medicine

## 2013-06-12 DIAGNOSIS — W108XXA Fall (on) (from) other stairs and steps, initial encounter: Secondary | ICD-10-CM | POA: Insufficient documentation

## 2013-06-12 DIAGNOSIS — S93409A Sprain of unspecified ligament of unspecified ankle, initial encounter: Secondary | ICD-10-CM | POA: Insufficient documentation

## 2013-06-12 DIAGNOSIS — Z87828 Personal history of other (healed) physical injury and trauma: Secondary | ICD-10-CM | POA: Insufficient documentation

## 2013-06-12 DIAGNOSIS — S93402A Sprain of unspecified ligament of left ankle, initial encounter: Secondary | ICD-10-CM

## 2013-06-12 DIAGNOSIS — Y939 Activity, unspecified: Secondary | ICD-10-CM | POA: Insufficient documentation

## 2013-06-12 DIAGNOSIS — Z792 Long term (current) use of antibiotics: Secondary | ICD-10-CM | POA: Insufficient documentation

## 2013-06-12 DIAGNOSIS — F17200 Nicotine dependence, unspecified, uncomplicated: Secondary | ICD-10-CM | POA: Insufficient documentation

## 2013-06-12 DIAGNOSIS — X500XXA Overexertion from strenuous movement or load, initial encounter: Secondary | ICD-10-CM | POA: Insufficient documentation

## 2013-06-12 DIAGNOSIS — Z8669 Personal history of other diseases of the nervous system and sense organs: Secondary | ICD-10-CM | POA: Insufficient documentation

## 2013-06-12 DIAGNOSIS — Z8709 Personal history of other diseases of the respiratory system: Secondary | ICD-10-CM | POA: Insufficient documentation

## 2013-06-12 DIAGNOSIS — Z791 Long term (current) use of non-steroidal anti-inflammatories (NSAID): Secondary | ICD-10-CM | POA: Insufficient documentation

## 2013-06-12 DIAGNOSIS — E669 Obesity, unspecified: Secondary | ICD-10-CM | POA: Insufficient documentation

## 2013-06-12 DIAGNOSIS — Y929 Unspecified place or not applicable: Secondary | ICD-10-CM | POA: Insufficient documentation

## 2013-06-12 MED ORDER — IBUPROFEN 800 MG PO TABS
800.0000 mg | ORAL_TABLET | Freq: Once | ORAL | Status: AC
  Administered 2013-06-13: 800 mg via ORAL
  Filled 2013-06-12: qty 1

## 2013-06-12 NOTE — ED Notes (Signed)
Pt c/o L ankle pain and swelling and L foot pain. Pt states he fell down some stairs tonight. States he is unable to bear wt on L leg. Pt states he previously broke his L foot.
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
## 2013-06-13 MED ORDER — OXYCODONE-ACETAMINOPHEN 5-325 MG PO TABS
1.0000 | ORAL_TABLET | Freq: Three times a day (TID) | ORAL | Status: DC | PRN
Start: 2013-06-13 — End: 2014-12-29

## 2013-06-13 MED ORDER — OXYCODONE-ACETAMINOPHEN 5-325 MG PO TABS
1.0000 | ORAL_TABLET | Freq: Once | ORAL | Status: AC
  Administered 2013-06-13: 1 via ORAL
  Filled 2013-06-13: qty 1

## 2013-06-13 MED ORDER — IBUPROFEN 800 MG PO TABS: 800.0000 mg | ORAL_TABLET | Freq: Three times a day (TID) | ORAL | Status: DC

## 2013-06-13 NOTE — Discharge Instructions (Signed)
Please follow up with your primary care physician in 1-2 days. If you do not have one please call the [facility] wellness [facility] number listed above. Please follow up with orthopedist, Dr. Ave Filter, to schedule a follow up appointment. Please take pain medication and/or muscle relaxants as prescribed and as needed for pain. Please do not drive on narcotic pain medication or on muscle relaxants. Please follow RICE method below. Please read all discharge instructions and return precautions.    Ankle Sprain An ankle sprain is an injury to the strong, fibrous tissues (ligaments) that hold the bones of your ankle joint together.  CAUSES An ankle sprain is usually caused by a fall or by twisting your ankle. Ankle sprains most commonly occur when you step on the outer edge of your foot, and your ankle turns inward. People who participate in sports are more prone to these types of injuries.  SYMPTOMS   Pain in your ankle. The pain may be present at rest or only when you are trying to stand or walk.  Swelling.  Bruising. Bruising may develop immediately or within 1 to 2 days after your injury.  Difficulty standing or walking, particularly when turning corners or changing directions. DIAGNOSIS  Your caregiver will ask you details about your injury and perform a physical exam of your ankle to determine if you have an ankle sprain. During the physical exam, your caregiver will press on and apply pressure to specific areas of your foot and ankle. Your caregiver will try to move your ankle in certain ways. An X-ray exam may be done to be sure a bone was not broken or a ligament did not separate from one of the bones in your ankle (avulsion fracture).  TREATMENT  Certain types of braces can help stabilize your ankle. Your caregiver can make a recommendation for this. Your caregiver may recommend the use of medicine for pain. If your sprain is severe, your caregiver may refer you to a surgeon who helps to  restore function to parts of your skeletal system (orthopedist) or a physical therapist. HOME CARE INSTRUCTIONS   Apply ice to your injury for 1 2 days or as directed by your caregiver. Applying ice helps to reduce inflammation and pain.  Put ice in a plastic bag.  Place a towel between your skin and the bag.  Leave the ice on for 15-20 minutes at a time, every 2 hours while you are awake.  Only take over-the-counter or prescription medicines for pain, discomfort, or fever as directed by your caregiver.  Elevate your injured ankle above the level of your heart as much as possible for 2 3 days.  If your caregiver recommends crutches, use them as instructed. Gradually put weight on the affected ankle. Continue to use crutches or a cane until you can walk without feeling pain in your ankle.  If you have a plaster splint, wear the splint as directed by your caregiver. Do not rest it on anything harder than a pillow for the first 24 hours. Do not put weight on it. Do not get it wet. You may take it off to take a shower or bath.  You may have been given an elastic bandage to wear around your ankle to provide support. If the elastic bandage is too tight (you have numbness or tingling in your foot or your foot becomes cold and blue), adjust the bandage to make it comfortable.  If you have an air splint, you may blow more air  into it or let air out to make it more comfortable. You may take your splint off at night and before taking a shower or bath. Wiggle your toes in the splint several times per day to decrease swelling. SEEK MEDICAL CARE IF:   You have rapidly increasing bruising or swelling.  Your toes feel extremely cold or you lose feeling in your foot.  Your pain is not relieved with medicine. SEEK IMMEDIATE MEDICAL CARE IF:  Your toes are numb or blue.  You have severe pain that is increasing. MAKE SURE YOU:   Understand these instructions.  Will watch your condition.  Will get  help right away if you are not doing well or get worse. Document Released: 12/26/2004 Document Revised: 09/20/2011 Document Reviewed: 01/07/2011 [facility] Patient Information 2014 [place], [place]. RICE: Routine Care for Injuries The routine care of many injuries includes Rest, Ice, Compression, and Elevation (RICE). HOME CARE INSTRUCTIONS  Rest is needed to allow your body to heal. Routine activities can usually be resumed when comfortable. Injured tendons and bones can take up to 6 weeks to heal. Tendons are the cord-like structures that attach muscle to bone.  Ice following an injury helps keep the swelling down and reduces pain.  Put ice in a plastic bag.  Place a towel between your skin and the bag.  Leave the ice on for 15-20 minutes, 03-04 times a day. Do this while awake, for the first 24 to 48 hours. After that, continue as directed by your caregiver.  Compression helps keep swelling down. It also gives support and helps with discomfort. If an elastic bandage has been applied, it should be removed and reapplied every 3 to 4 hours. It should not be applied tightly, but firmly enough to keep swelling down. Watch fingers or toes for swelling, bluish discoloration, coldness, numbness, or excessive pain. If any of these problems occur, remove the bandage and reapply loosely. Contact your caregiver if these problems continue.  Elevation helps reduce swelling and decreases pain. With extremities, such as the arms, hands, legs, and feet, the injured area should be placed near or above the level of the heart, if possible. SEEK IMMEDIATE MEDICAL CARE IF:  You have persistent pain and swelling.  You develop redness, numbness, or unexpected weakness.  Your symptoms are getting worse rather than improving after several days. These symptoms may indicate that further evaluation or further X-rays are needed. Sometimes, X-rays may not show a small broken bone (fracture) until 1 week or 10 days  later. Make a follow-up appointment with your caregiver. Ask when your X-ray results will be ready. Make sure you get your X-ray results. Document Released: 04/09/2000 Document Revised: 03/20/2011 Document Reviewed: 05/27/2010 [facility] Patient Information 2014 [place], [place].

## 2013-06-13 NOTE — ED Notes (Signed)
Pt has a ride home.  

## 2013-06-17 NOTE — ED Provider Notes (Signed)
Medical screening examination/treatment/procedure(s) were performed by non-physician practitioner and as supervising physician I was immediately available for consultation/collaboration.   EKG Interpretation None       Raeford Razor, MD 06/17/13 0021

## 2013-09-03 ENCOUNTER — Emergency Department (HOSPITAL_COMMUNITY)
Admission: EM | Admit: 2013-09-03 | Discharge: 2013-09-03 | Disposition: A | Discharge status: Home / Self Care | Payer: Federal, State, Local not specified - PPO | Attending: Emergency Medicine | Admitting: Emergency Medicine

## 2013-09-03 ENCOUNTER — Encounter (HOSPITAL_COMMUNITY): Payer: Self-pay | Admitting: Emergency Medicine

## 2013-09-03 DIAGNOSIS — E669 Obesity, unspecified: Secondary | ICD-10-CM | POA: Diagnosis not present

## 2013-09-03 DIAGNOSIS — K0889 Other specified disorders of teeth and supporting structures: Secondary | ICD-10-CM

## 2013-09-03 DIAGNOSIS — Z87828 Personal history of other (healed) physical injury and trauma: Secondary | ICD-10-CM | POA: Insufficient documentation

## 2013-09-03 DIAGNOSIS — Z8669 Personal history of other diseases of the nervous system and sense organs: Secondary | ICD-10-CM | POA: Diagnosis not present

## 2013-09-03 DIAGNOSIS — F17200 Nicotine dependence, unspecified, uncomplicated: Secondary | ICD-10-CM | POA: Insufficient documentation

## 2013-09-03 DIAGNOSIS — K089 Disorder of teeth and supporting structures, unspecified: Secondary | ICD-10-CM | POA: Insufficient documentation

## 2013-09-03 DIAGNOSIS — Z8709 Personal history of other diseases of the respiratory system: Secondary | ICD-10-CM | POA: Insufficient documentation

## 2013-09-03 MED ORDER — IBUPROFEN 800 MG PO TABS: 800.0000 mg | ORAL_TABLET | Freq: Three times a day (TID) | ORAL | Status: DC | PRN

## 2013-09-03 MED ORDER — TRAMADOL HCL 50 MG PO TABS: 50.0000 mg | ORAL_TABLET | Freq: Four times a day (QID) | ORAL | Status: DC | PRN

## 2013-09-03 MED ORDER — PENICILLIN V POTASSIUM 500 MG PO TABS: 500.0000 mg | ORAL_TABLET | Freq: Four times a day (QID) | ORAL | Status: AC

## 2013-09-03 NOTE — ED Notes (Signed)
Pt c/o dental pain from wisdom teeth since yesterday.

## 2013-09-03 NOTE — Discharge Instructions (Signed)
You have a dental injury. Use the resource guide listed below to help you find a dentist if you do not already have one to followup with. It is very important that you get evaluated by a dentist as soon as possible. Call tomorrow to schedule an appointment. Use your pain medication as prescribed and do not operate heavy machinery while on pain medication. Take your full course of antibiotics. Read the instructions below. ° °Eat a soft or liquid diet and rinse your mouth out after meals with warm water. You should see a dentist or return here at once if you have increased swelling, increased pain or uncontrolled bleeding from the site of your injury. ° ° °SEEK MEDICAL CARE IF:  °· You have increased pain not controlled with medicines.  °· You have swelling around your tooth, in your face or neck.  °· You have bleeding which starts, continues, or gets worse.  °· You have a fever >101 °· If you are unable to open your mouth ° °RESOURCE GUIDE ° °Dental Problems ° °Patients with Medicaid: °[facility]                     [facility] °[address]                                           [address] °Phone:  [phone]                                                  Phone:  510-2600 ° °If unable to pay or uninsured, contact:  [facility] or [facility] to become qualified for the adult dental clinic. ° °Chronic Pain Problems °Contact [facility]  297-2271 °Patients need to be referred by their primary care doctor. ° °Insufficient Money for Medicine °Contact [employer]:  call "211" or [facility] 271-5999. ° °No Primary Care Doctor °Call [facility]  832-8000 °Other agencies that provide inexpensive medical care °   [facility]  832-8035 °   [facility]  832-7272 °   [facility]  271-5999 °   [facility]  832-4777 °   [facility]  373-0678 °   [facility]   272-1050 ° °Psychological Services °[facility]  832-9600 °[facility]  378-7881 °[facility]   800 853-5163 (emergency services 641-4993) ° °Substance Abuse Resources °[facility]  336-882-2125 °[facility] 336-784-9470 °[facility] 336-285-9073 °[facility] 336-845-3988 °Residential & Outpatient Substance Abuse Program  800-659-3381 ° °Abuse/Neglect °[place] [facility] (336) 641-3795 °[place] [facility] 800-378-5315 (After Hours) ° °Emergency Shelter °[facility] (336) 271-5985 ° °Maternity Homes °[facility] (336) 275-9566 °[facility] (704) 372-4663 ° °MRSA Hotline #:   832-7006 ° ° ° °[place] [facility] ° °[facility] [place]     [employer]                          [facility] °[address] [place]                         [address]      [address]  °[place]                                                [place]                            [place] °Phone:  349-3220                                   Phone:  [phone]                 Phone:  342-8140 ° °[facility] °Phone:  342-8316 ° °[place] [facility] Abuse Hotline °(336) 342-1394 °(336) 342-3537 (After Hours) ° ° ° ° °
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
## 2013-09-04 ENCOUNTER — Telehealth: Payer: Self-pay | Admitting: Family Medicine

## 2013-09-04 NOTE — Telephone Encounter (Signed)
ER letter sent 

## 2013-09-04 NOTE — ED Provider Notes (Signed)
Medical screening examination/treatment/procedure(s) were performed by non-physician practitioner and as supervising physician I was immediately available for consultation/collaboration.   EKG Interpretation None      Devoria Albe, MD, Armando Gang   Ward Givens, MD 09/04/13 1600

## 2013-10-29 ENCOUNTER — Ambulatory Visit: Payer: Self-pay | Admitting: Family Medicine

## 2013-11-11 ENCOUNTER — Encounter: Payer: Self-pay | Admitting: Family Medicine

## 2014-11-23 ENCOUNTER — Encounter: Payer: Self-pay | Admitting: Family Medicine

## 2014-11-23 DIAGNOSIS — Z0000 Encounter for general adult medical examination without abnormal findings: Secondary | ICD-10-CM

## 2014-11-27 ENCOUNTER — Encounter: Payer: Self-pay | Admitting: Family Medicine

## 2014-12-21 ENCOUNTER — Encounter: Payer: Self-pay | Admitting: Family Medicine

## 2014-12-29 ENCOUNTER — Encounter: Payer: Self-pay | Admitting: Internal Medicine

## 2014-12-29 ENCOUNTER — Ambulatory Visit (INDEPENDENT_AMBULATORY_CARE_PROVIDER_SITE_OTHER): Payer: No Typology Code available for payment source | Admitting: Internal Medicine

## 2014-12-29 VITALS — BP 130/84 | HR 70 | Temp 98.0°F | Ht 71.0 in | Wt 261.0 lb

## 2014-12-29 DIAGNOSIS — M549 Dorsalgia, unspecified: Secondary | ICD-10-CM | POA: Diagnosis not present

## 2014-12-29 DIAGNOSIS — F419 Anxiety disorder, unspecified: Principal | ICD-10-CM

## 2014-12-29 DIAGNOSIS — M79673 Pain in unspecified foot: Secondary | ICD-10-CM | POA: Diagnosis not present

## 2014-12-29 DIAGNOSIS — G8929 Other chronic pain: Secondary | ICD-10-CM

## 2014-12-29 DIAGNOSIS — F418 Other specified anxiety disorders: Secondary | ICD-10-CM | POA: Diagnosis not present

## 2014-12-29 DIAGNOSIS — F329 Major depressive disorder, single episode, unspecified: Secondary | ICD-10-CM
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
## 2014-12-29 NOTE — Patient Instructions (Signed)
Major Depressive Disorder Major depressive disorder is a mental illness. It also may be called clinical depression or unipolar depression. Major depressive disorder usually causes feelings of sadness, hopelessness, or helplessness. Some people with this disorder do not feel particularly sad but lose interest in doing things they used to enjoy (anhedonia). Major depressive disorder also can cause physical symptoms. It can interfere with work, school, relationships, and other normal everyday activities. The disorder varies in severity but is longer lasting and more serious than the sadness we all feel from time to time in our lives. Major depressive disorder often is triggered by stressful life events or major life changes. Examples of these triggers include divorce, loss of your job or home, a move, and the death of a family member or close friend. Sometimes this disorder occurs for no obvious reason at all. People who have family members with major depressive disorder or bipolar disorder are at higher risk for developing this disorder, with or without life stressors. Major depressive disorder can occur at any age. It may occur just once in your life (single episode major depressive disorder). It may occur multiple times (recurrent major depressive disorder). SYMPTOMS People with major depressive disorder have either anhedonia or depressed mood on nearly a daily basis for at least 2 weeks or longer. Symptoms of depressed mood include:  Feelings of sadness (blue or down in the dumps) or emptiness.  Feelings of hopelessness or helplessness.  Tearfulness or episodes of crying (may be observed by others).  Irritability (children and adolescents). In addition to depressed mood or anhedonia or both, people with this disorder have at least four of the following symptoms:  Difficulty sleeping or sleeping too much.   Significant change (increase or decrease) in appetite or weight.   Lack of energy or  motivation.  Feelings of guilt and worthlessness.   Difficulty concentrating, remembering, or making decisions.  Unusually slow movement (psychomotor retardation) or restlessness (as observed by others).   Recurrent wishes for death, recurrent thoughts of self-harm (suicide), or a suicide attempt. People with major depressive disorder commonly have persistent negative thoughts about themselves, other people, and the world. People with severe major depressive disorder may experiencedistorted beliefs or perceptions about the world (psychotic delusions). They also may see or hear things that are not real (psychotic hallucinations). DIAGNOSIS Major depressive disorder is diagnosed through an assessment by your health care provider. Your health care provider will ask aboutaspects of your daily life, such as mood,sleep, and appetite, to see if you have the diagnostic symptoms of major depressive disorder. Your health care provider may ask about your medical history and use of alcohol or drugs, including prescription medicines. Your health care provider also may do a physical exam and blood work. This is because certain medical conditions and the use of certain substances can cause major depressive disorder-like symptoms (secondary depression). Your health care provider also may refer you to a mental health specialist for further evaluation and treatment. TREATMENT It is important to recognize the symptoms of major depressive disorder and seek treatment. The following treatments can be prescribed for this disorder:   Medicine. Antidepressant medicines usually are prescribed. Antidepressant medicines are thought to correct chemical imbalances in the brain that are commonly associated with major depressive disorder. Other types of medicine may be added if the symptoms do not respond to antidepressant medicines alone or if psychotic delusions or hallucinations occur.  Talk therapy. Talk therapy can be  helpful in treating major depressive disorder by providing   support, education, and guidance. Certain types of talk therapy also can help with negative thinking (cognitive behavioral therapy) and with relationship issues that trigger this disorder (interpersonal therapy). A mental health specialist can help determine which treatment is best for you. Most people with major depressive disorder do well with a combination of medicine and talk therapy. Treatments involving electrical stimulation of the brain can be used in situations with extremely severe symptoms or when medicine and talk therapy do not work over time. These treatments include electroconvulsive therapy, transcranial magnetic stimulation, and vagal nerve stimulation.   This information is not intended to replace advice given to you by your health care provider. Make sure you discuss any questions you have with your health care provider.   Document Released: 04/22/2012 Document Revised: 01/16/2014 Document Reviewed: 04/22/2012 Elsevier Interactive Patient Education 2016 [employer]  

## 2014-12-29 NOTE — Progress Notes (Signed)
Pre visit review using our clinic review tool, if applicable. No additional management support is needed unless otherwise documented below in the visit note. 

## 2015-01-14 ENCOUNTER — Ambulatory Visit: Payer: No Typology Code available for payment source | Admitting: Internal Medicine

## 2015-01-20 ENCOUNTER — Ambulatory Visit: Payer: Self-pay | Admitting: Licensed Clinical Social Worker

## 2015-02-15 IMAGING — CR DG CHEST 2V
2 series · 2 of 2 positions shown · non-contrast
Comparison: 10/07/2009

CLINICAL DATA: Cough and short of breath

CHEST - 2 VIEW

[w chest pa]
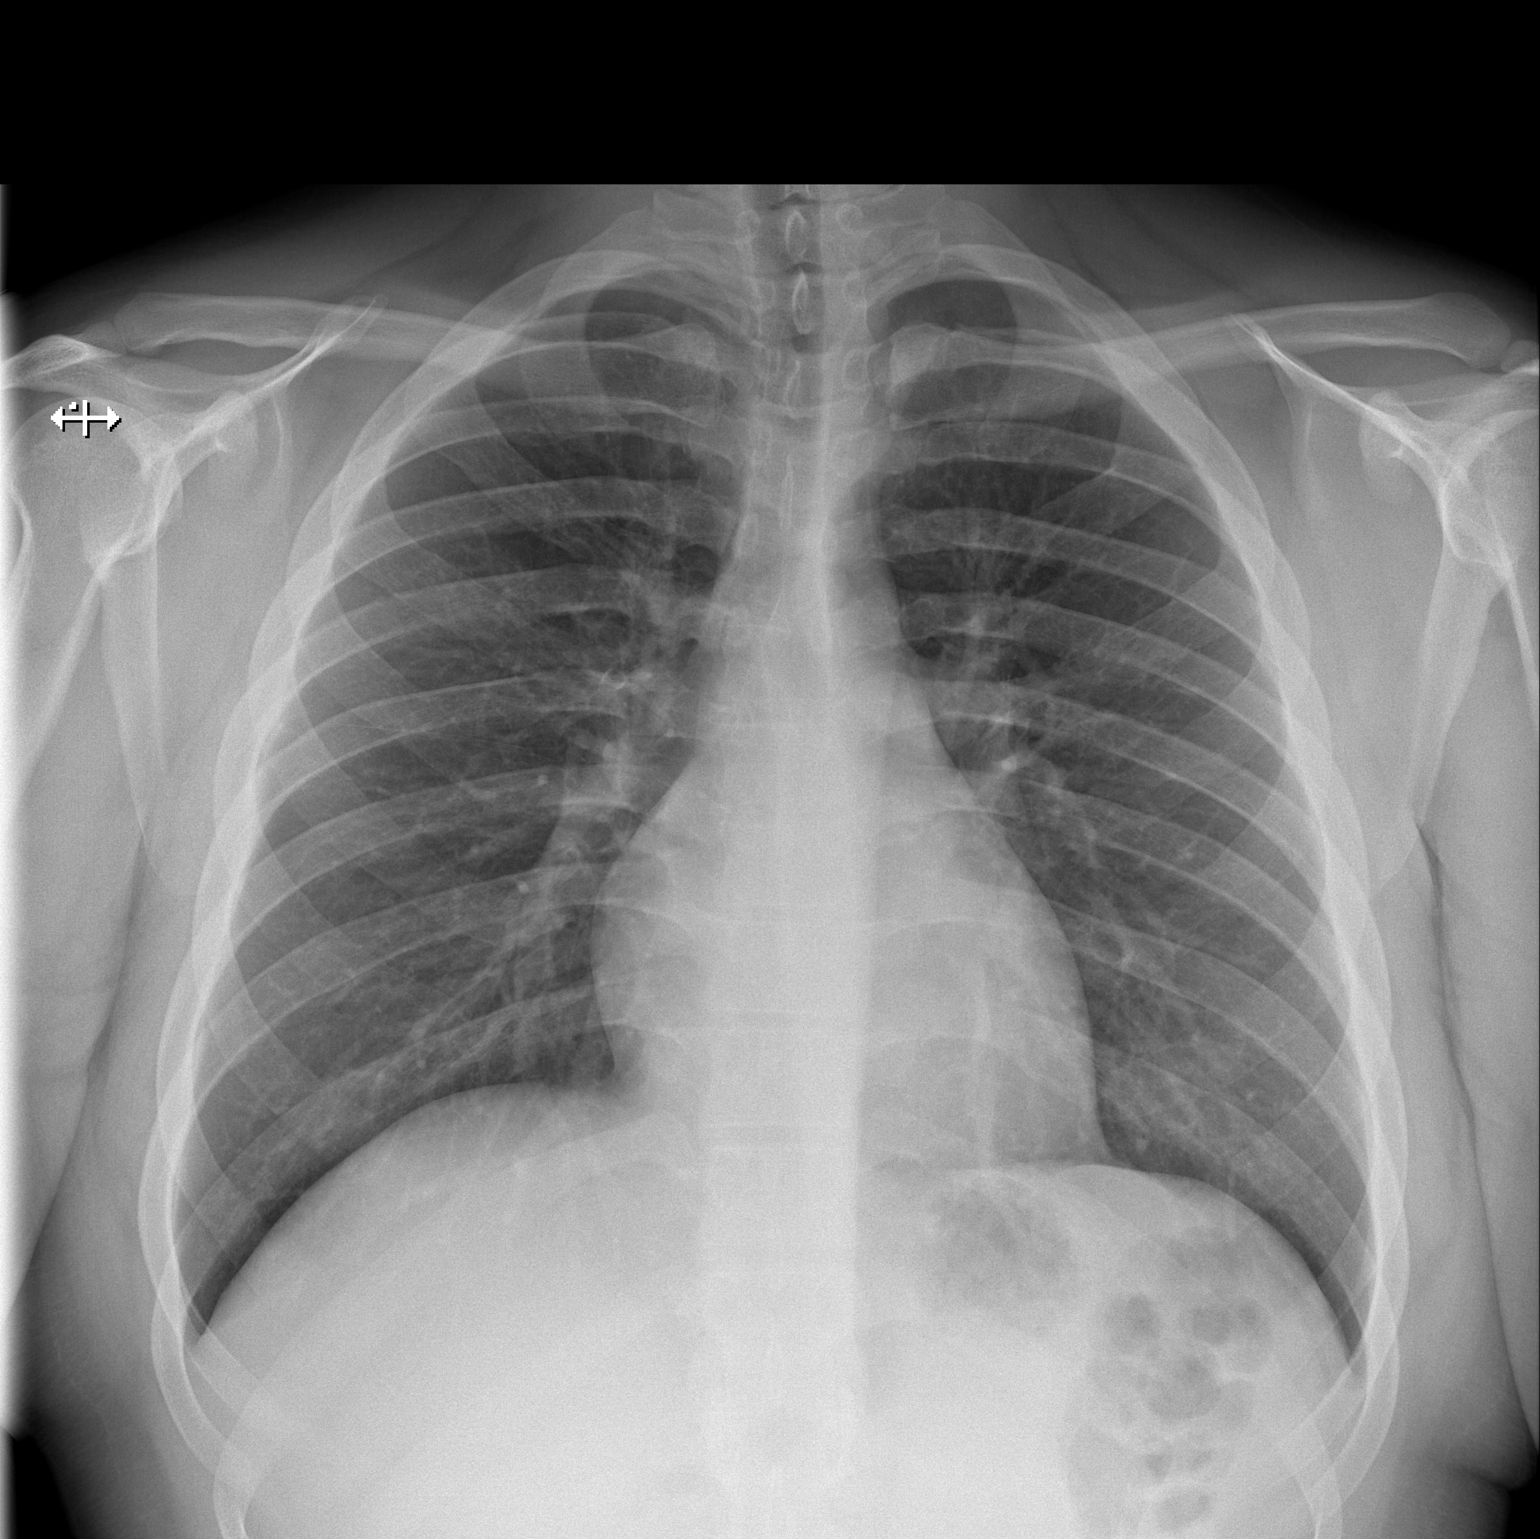

[w chest lat]
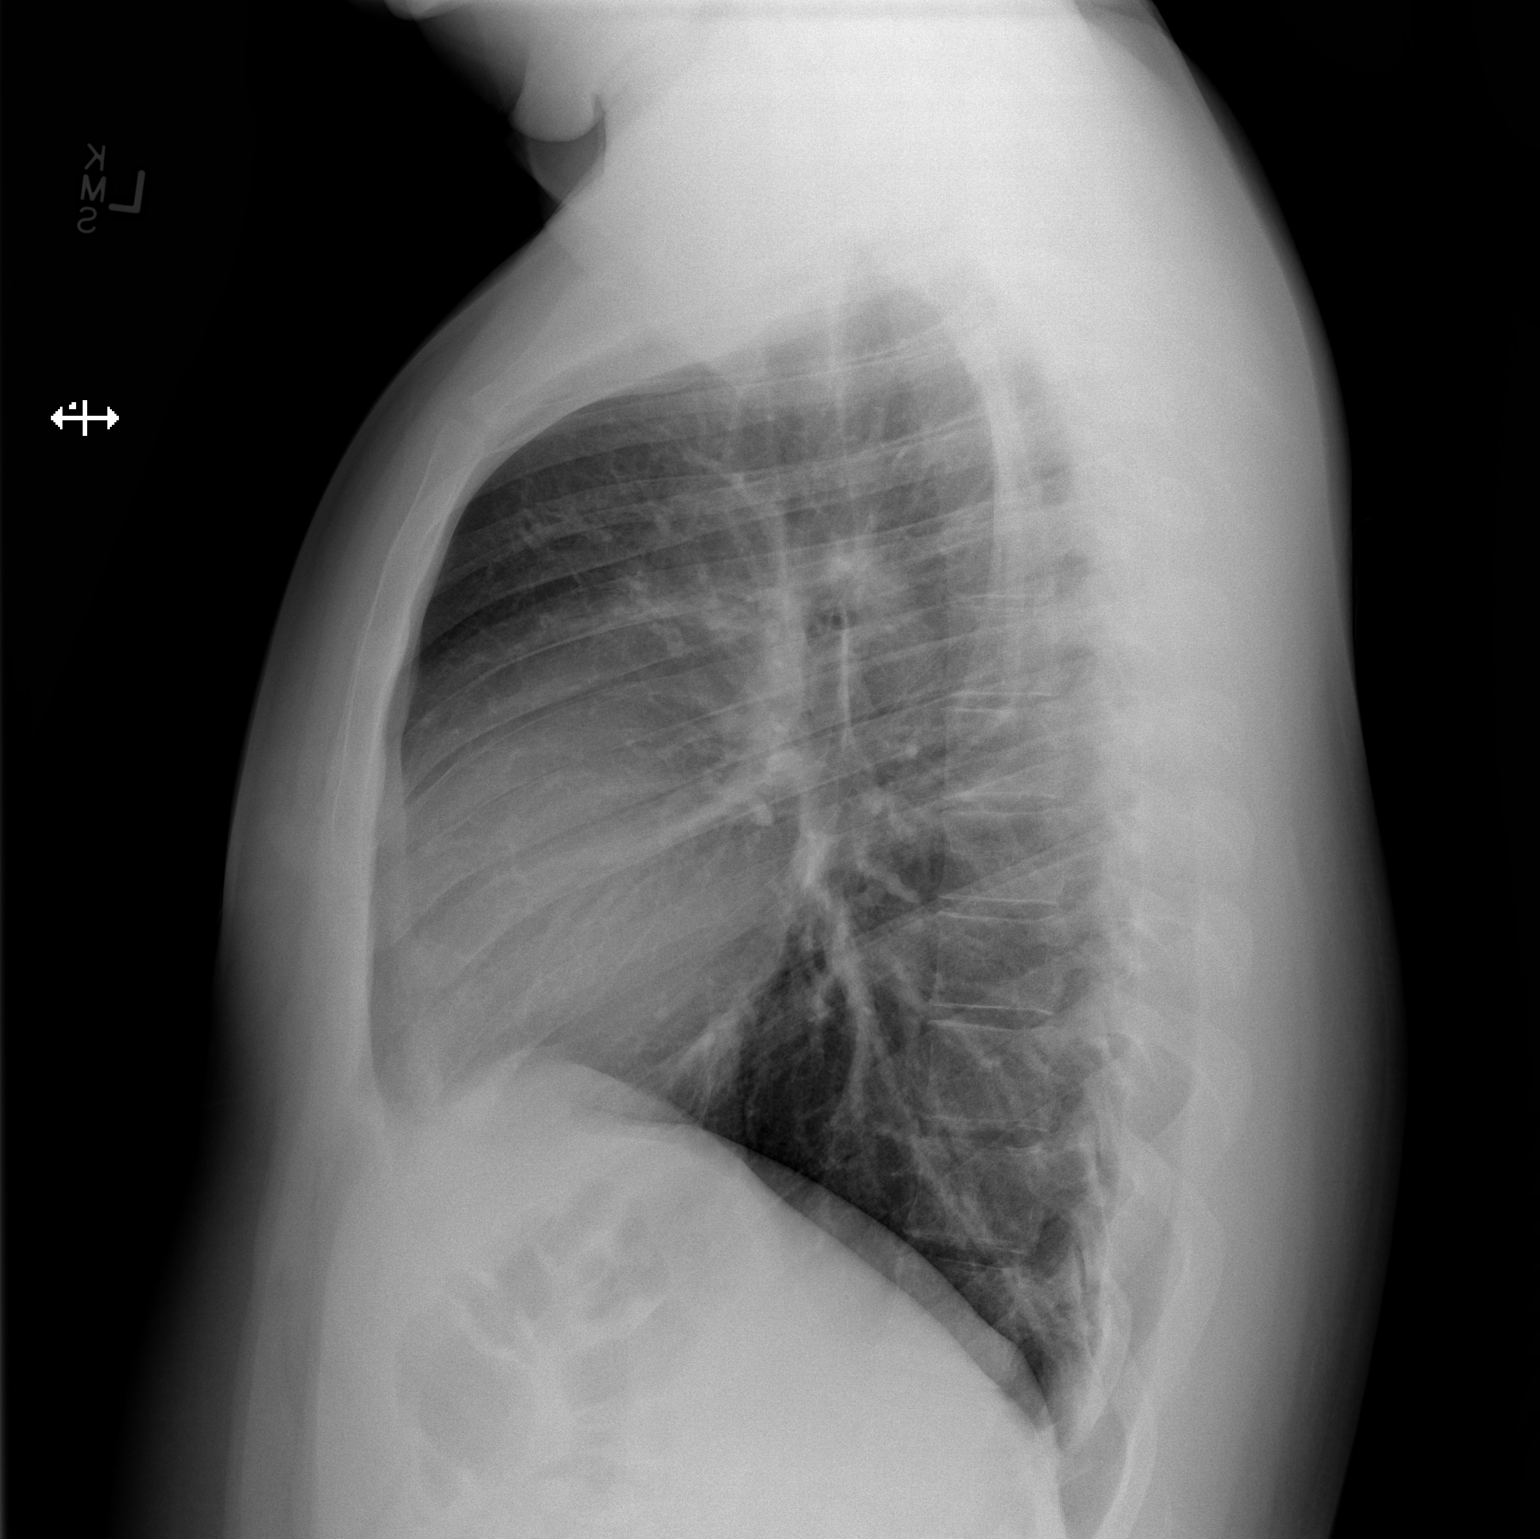

[2 of 2 positions shown; findings below may reference images not displayed]

FINDINGS: The heart size and mediastinal contours are within
normal limits.  Both lungs are clear.  The visualized skeletal
structures are unremarkable.
IMPRESSION: No active cardiopulmonary disease.

## 2015-03-04 ENCOUNTER — Ambulatory Visit: Payer: Self-pay | Admitting: Psychiatry

## 2015-05-06 ENCOUNTER — Emergency Department (HOSPITAL_BASED_OUTPATIENT_CLINIC_OR_DEPARTMENT_OTHER): Payer: No Typology Code available for payment source

## 2015-05-06 ENCOUNTER — Emergency Department (HOSPITAL_BASED_OUTPATIENT_CLINIC_OR_DEPARTMENT_OTHER)
Admission: EM | Admit: 2015-05-06 | Discharge: 2015-05-06 | Disposition: A | Discharge status: Home / Self Care | Payer: No Typology Code available for payment source | Attending: Emergency Medicine | Admitting: Emergency Medicine

## 2015-05-06 ENCOUNTER — Encounter (HOSPITAL_BASED_OUTPATIENT_CLINIC_OR_DEPARTMENT_OTHER): Payer: Self-pay | Admitting: *Deleted

## 2015-05-06 DIAGNOSIS — M25571 Pain in right ankle and joints of right foot: Secondary | ICD-10-CM | POA: Insufficient documentation

## 2015-05-06 DIAGNOSIS — F17210 Nicotine dependence, cigarettes, uncomplicated: Secondary | ICD-10-CM | POA: Insufficient documentation

## 2015-05-06 DIAGNOSIS — E669 Obesity, unspecified: Secondary | ICD-10-CM | POA: Insufficient documentation

## 2015-05-06 DIAGNOSIS — F329 Major depressive disorder, single episode, unspecified: Secondary | ICD-10-CM | POA: Diagnosis not present

## 2015-05-06 DIAGNOSIS — J45909 Unspecified asthma, uncomplicated: Secondary | ICD-10-CM | POA: Diagnosis not present

## 2015-05-06 DIAGNOSIS — S93401A Sprain of unspecified ligament of right ankle, initial encounter: Secondary | ICD-10-CM

## 2015-05-06 MED ORDER — NAPROXEN 375 MG PO TABS: 375.0000 mg | ORAL_TABLET | Freq: Two times a day (BID) | ORAL | Status: AC

## 2015-05-06 MED ORDER — NAPROXEN 250 MG PO TABS
500.0000 mg | ORAL_TABLET | Freq: Once | ORAL | Status: AC
  Administered 2015-05-06: 500 mg via ORAL
  Filled 2015-05-06: qty 2

## 2015-05-06 NOTE — ED Notes (Signed)
Pt c/o right ankle injury x 1 day ago 
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
## 2015-05-06 NOTE — Discharge Instructions (Signed)

## 2015-05-11 IMAGING — CR DG FOOT COMPLETE 3+V*R*
3 series · 3 of 3 positions shown · non-contrast
Comparison: None

CLINICAL DATA: Foot injury with pain

RIGHT FOOT COMPLETE - 3+ VIEW

[t foot lat right]
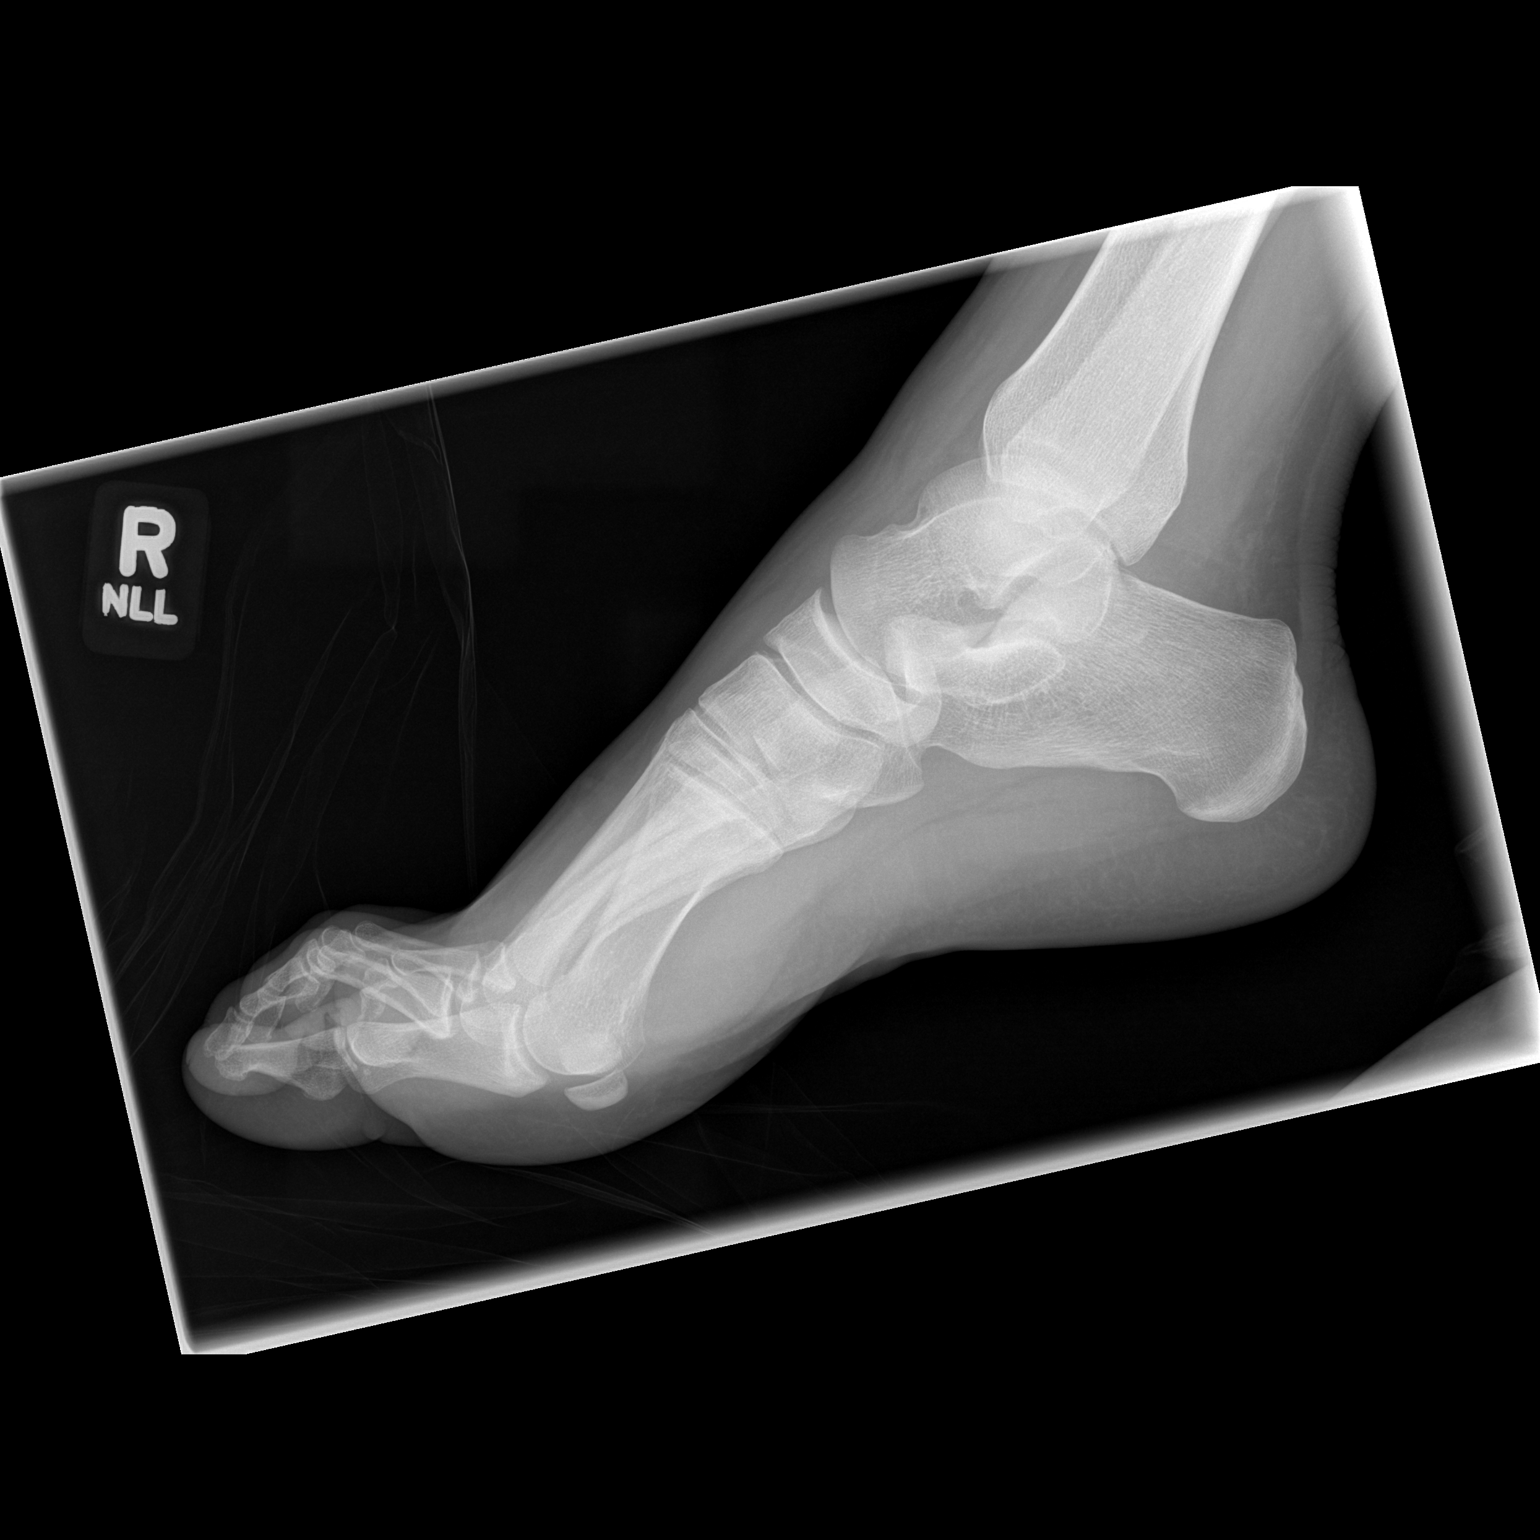

[t foot ap right]
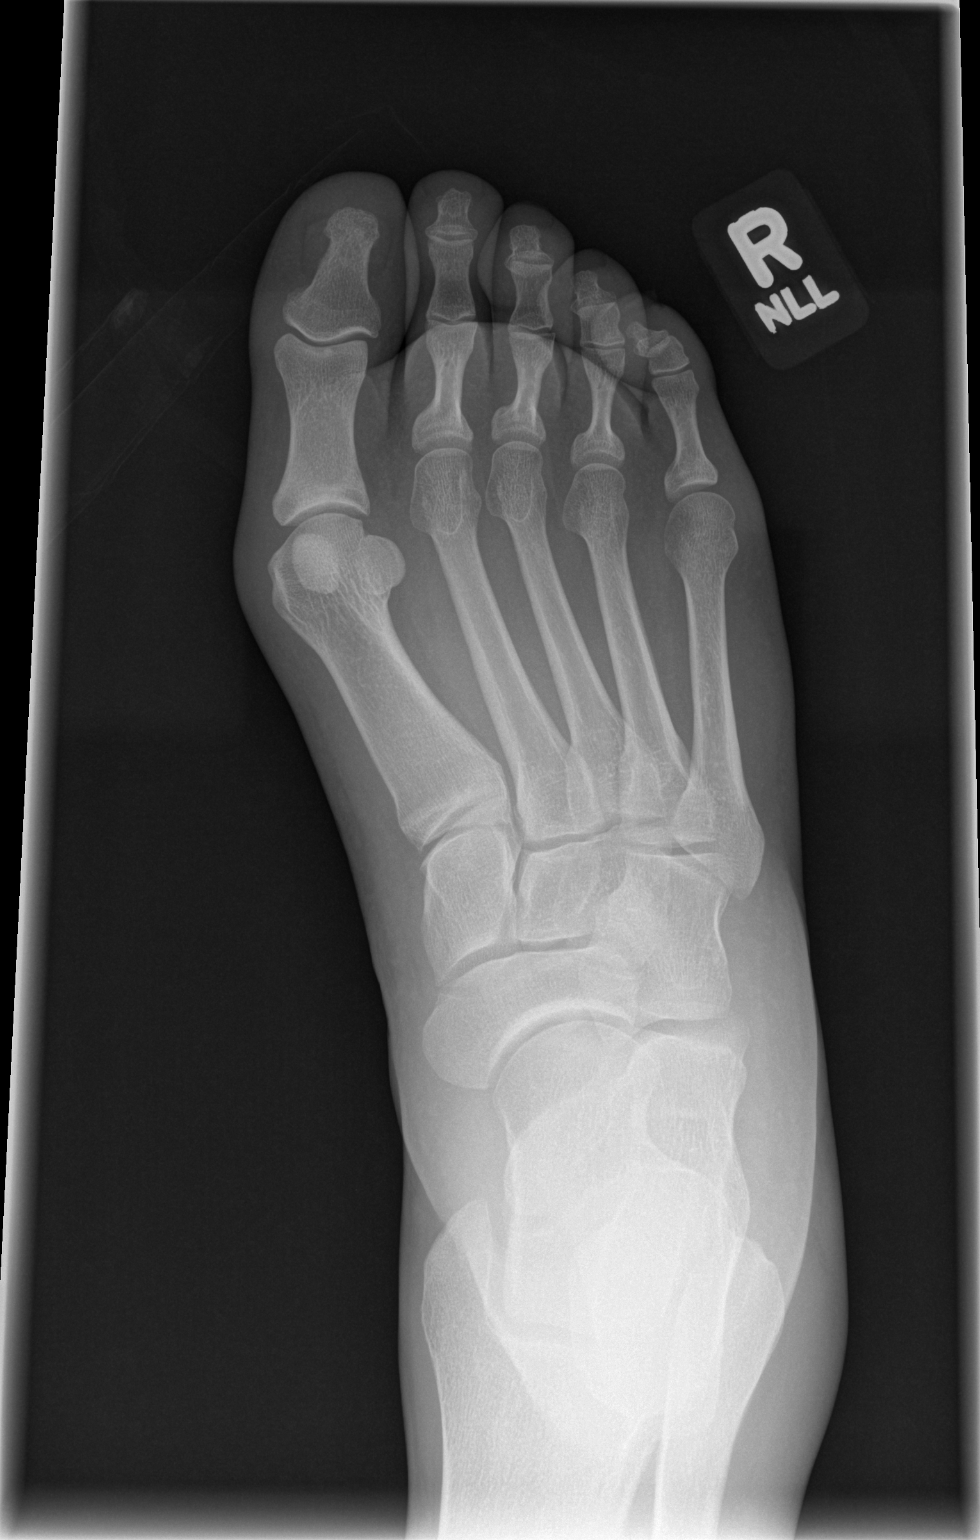

[t foot oblique right]
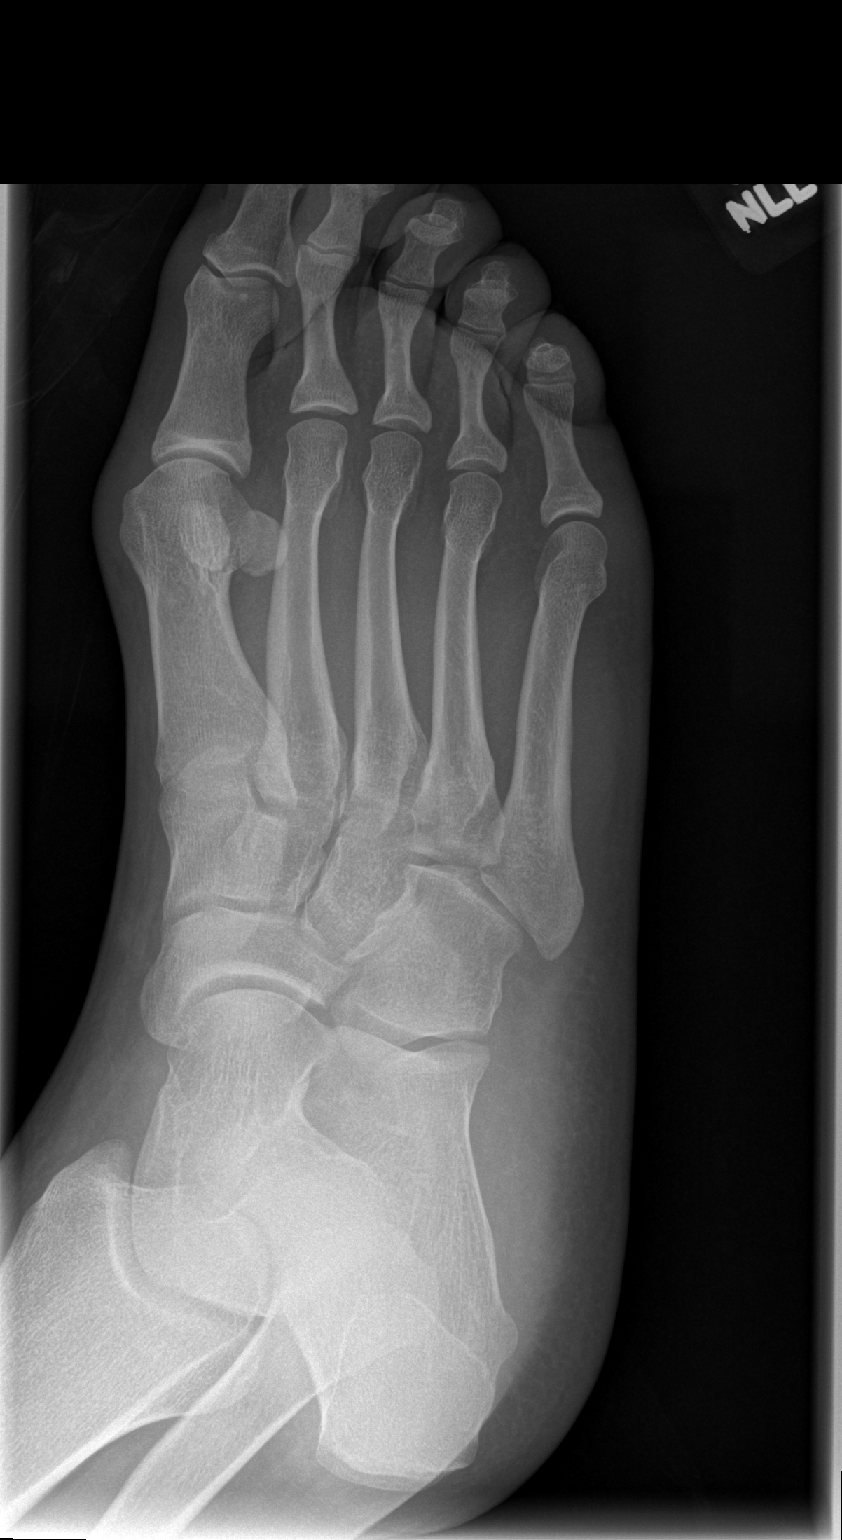

[3 of 3 positions shown; findings below may reference images not displayed]

FINDINGS: Normal alignment and no fracture.  Mild hallux valgus
deformity of the great toe.  Otherwise negative.
IMPRESSION: Negative for fracture.

## 2015-06-11 ENCOUNTER — Other Ambulatory Visit: Payer: Self-pay | Admitting: Internal Medicine

## 2015-06-11 ENCOUNTER — Encounter: Payer: Self-pay | Admitting: Internal Medicine

## 2015-06-11 ENCOUNTER — Ambulatory Visit (INDEPENDENT_AMBULATORY_CARE_PROVIDER_SITE_OTHER): Payer: No Typology Code available for payment source | Admitting: Internal Medicine

## 2015-06-11 ENCOUNTER — Telehealth: Payer: Self-pay | Admitting: Internal Medicine

## 2015-06-11 VITALS — BP 122/80 | HR 51 | Temp 98.1°F | Wt 264.8 lb

## 2015-06-11 DIAGNOSIS — Z113 Encounter for screening for infections with a predominantly sexual mode of transmission: Secondary | ICD-10-CM

## 2015-06-11 DIAGNOSIS — R2241 Localized swelling, mass and lump, right lower limb: Secondary | ICD-10-CM | POA: Diagnosis not present

## 2015-06-11 NOTE — Patient Instructions (Signed)
Sexually Transmitted Disease °A sexually transmitted disease (STD) is a disease or infection that may be passed (transmitted) from person to person, usually during sexual activity. This may happen by way of saliva, semen, blood, vaginal mucus, or urine. Common STDs include: °· Gonorrhea. °· Chlamydia. °· Syphilis. °· HIV and AIDS. °· Genital herpes. °· Hepatitis B and C. °· Trichomonas. °· Human papillomavirus (HPV). °· Pubic lice. °· Scabies. °· Mites. °· Bacterial vaginosis. °WHAT ARE CAUSES OF STDs? °An STD may be caused by bacteria, a virus, or parasites. STDs are often transmitted during sexual activity if one person is infected. However, they may also be transmitted through nonsexual means. STDs may be transmitted after:  °· Sexual intercourse with an infected person. °· Sharing sex toys with an infected person. °· Sharing needles with an infected person or using unclean piercing or tattoo needles. °· Having intimate contact with the genitals, mouth, or rectal areas of an infected person. °· Exposure to infected fluids during birth. °WHAT ARE THE SIGNS AND SYMPTOMS OF STDs? °Different STDs have different symptoms. Some people may not have any symptoms. If symptoms are present, they may include: °· Painful or bloody urination. °· Pain in the pelvis, abdomen, vagina, anus, throat, or eyes. °· A skin rash, itching, or irritation. °· Growths, ulcerations, blisters, or sores in the genital and anal areas. °· Abnormal vaginal discharge with or without bad odor. °· Penile discharge in men. °· Fever. °· Pain or bleeding during sexual intercourse. °· Swollen glands in the groin area. °· Yellow skin and eyes (jaundice). This is seen with hepatitis. °· Swollen testicles. °· Infertility. °· Sores and blisters in the mouth. °HOW ARE STDs DIAGNOSED? °To make a diagnosis, your health care provider may: °· Take a medical history. °· Perform a physical exam. °· Take a sample of any discharge to examine. °· Swab the throat,  cervix, opening to the penis, rectum, or vagina for testing. °· Test a sample of your first morning urine. °· Perform blood tests. °· Perform a Pap test, if this applies. °· Perform a colposcopy. °· Perform a laparoscopy. °HOW ARE STDs TREATED? °Treatment depends on the STD. Some STDs may be treated but not cured. °· Chlamydia, gonorrhea, trichomonas, and syphilis can be cured with antibiotic medicine. °· Genital herpes, hepatitis, and HIV can be treated, but not cured, with prescribed medicines. The medicines lessen symptoms. °· Genital warts from HPV can be treated with medicine or by freezing, burning (electrocautery), or surgery. Warts may come back. °· HPV cannot be cured with medicine or surgery. However, abnormal areas may be removed from the cervix, vagina, or vulva. °· If your diagnosis is confirmed, your recent sexual partners need treatment. This is true even if they are symptom-free or have a negative culture or evaluation. They should not have sex until their health care providers say it is okay. °· Your health care provider may test you for infection again 3 months after treatment. °HOW CAN I REDUCE MY RISK OF GETTING AN STD? °Take these steps to reduce your risk of getting an STD: °· Use latex condoms, dental dams, and water-soluble lubricants during sexual activity. Do not use petroleum jelly or oils. °· Avoid having multiple sex partners. °· Do not have sex with someone who has other sex partners °· Do not have sex with anyone you do not know or who is at high risk for an STD. °· Avoid risky sex practices that can break your skin. °· Do not have sex   if you have open sores on your mouth or skin. °· Avoid drinking too much alcohol or taking illegal drugs. Alcohol and drugs can affect your judgment and put you in a vulnerable position. °· Avoid engaging in oral and anal sex acts. °· Get vaccinated for HPV and hepatitis. If you have not received these vaccines in the past, talk to your health care  provider about whether one or both might be right for you. °· If you are at risk of being infected with HIV, it is recommended that you take a prescription medicine daily to prevent HIV infection. This is called pre-exposure prophylaxis (PrEP). You are considered at risk if: °¨ You are a man who has sex with other men (MSM). °¨ You are a heterosexual man or woman and are sexually active with more than one partner. °¨ You take drugs by injection. °¨ You are sexually active with a partner who has HIV. °· Talk with your health care provider about whether you are at high risk of being infected with HIV. If you choose to begin PrEP, you should first be tested for HIV. You should then be tested every 3 months for as long as you are taking PrEP. °WHAT SHOULD I DO IF I THINK I HAVE AN STD? °· See your health care provider. °· Tell your sexual partner(s). They should be tested and treated for any STDs. °· Do not have sex until your health care provider says it is okay. °WHEN SHOULD I GET IMMEDIATE MEDICAL CARE? °Contact your health care provider right away if:  °· You have severe abdominal pain. °· You are a man and notice swelling or pain in your testicles. °· You are a woman and notice swelling or pain in your vagina. °  °This information is not intended to replace advice given to you by your health care provider. Make sure you discuss any questions you have with your health care provider. °  °Document Released: 03/18/2002 Document Revised: 01/16/2014 Document Reviewed: 07/16/2012 °Elsevier Interactive Patient Education ©2016 [employer] ° °
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
## 2015-06-11 NOTE — Telephone Encounter (Signed)
Can you please change US Misc Soft tissue to US lower extremity LIMITED

## 2015-06-11 NOTE — Progress Notes (Signed)
Pre visit review using our clinic review tool, if applicable. No additional management support is needed unless otherwise documented below in the visit note. 

## 2015-06-12 LAB — RPR

## 2015-06-12 LAB — HIV ANTIBODY (ROUTINE TESTING W REFLEX): HIV 1&2 Ab, 4th Generation: NONREACTIVE

## 2015-06-12 LAB — HEPATITIS C ANTIBODY: HCV Ab: NEGATIVE

## 2015-06-12 NOTE — Telephone Encounter (Signed)
done

## 2015-06-13 LAB — GC/CHLAMYDIA PROBE AMP
CT Probe RNA: NOT DETECTED
GC Probe RNA: NOT DETECTED

## 2015-06-14 ENCOUNTER — Ambulatory Visit
Admission: RE | Admit: 2015-06-14 | Discharge: 2015-06-14 | Disposition: A | Discharge status: Home / Self Care | Payer: No Typology Code available for payment source | Source: Ambulatory Visit | Attending: Internal Medicine | Admitting: Internal Medicine

## 2015-06-14 DIAGNOSIS — R2241 Localized swelling, mass and lump, right lower limb: Secondary | ICD-10-CM | POA: Diagnosis present

## 2015-06-14 LAB — HSV(HERPES SMPLX)ABS-I+II(IGG+IGM)-BLD
HERPES SIMPLEX VRS I-IGM AB (EIA): 1.66 {index} — AB
HSV 1 Glycoprotein G Ab, IgG: 42 Index — ABNORMAL HIGH (ref <0.90)

## 2015-06-16 ENCOUNTER — Other Ambulatory Visit: Payer: Self-pay | Admitting: Internal Medicine

## 2015-06-16 DIAGNOSIS — R2241 Localized swelling, mass and lump, right lower limb: Secondary | ICD-10-CM

## 2015-06-17 ENCOUNTER — Encounter: Payer: Self-pay | Admitting: *Deleted

## 2015-06-17 ENCOUNTER — Telehealth: Payer: Self-pay

## 2015-06-17 NOTE — Telephone Encounter (Signed)
He has to see general surgery to decide if he needs it drained or removed. Medication for what?

## 2015-06-17 NOTE — Telephone Encounter (Signed)
Pt left v/m wanting to know when he will get the area on leg drained and when will he be started on medication. Pt has appt with Dr Evette CristalSankar on 06/28/15. Pt request cb. Unable to reach pt on phone for more info. [facility] [address]

## 2015-06-17 NOTE — Telephone Encounter (Signed)
Pt notified as instructed and pt said when pt was called with results of US was advised might need to start on a medication for the mass on his leg. Pt request cb.

## 2015-06-17 NOTE — Telephone Encounter (Signed)
He does not need medication. He needs to see the general surgeon.

## 2015-06-18 NOTE — Telephone Encounter (Signed)
Shirlee LimerickMarion, is this possible?

## 2015-06-18 NOTE — Telephone Encounter (Signed)
Patient says he has been scheduled with Dr. Evette CristalSankar on 06/28/15 but [place] [facility] [place] [facility].

## 2015-06-18 NOTE — Telephone Encounter (Signed)
Moved Appt to 06/22/15 with Dr Donell BeersByerly at [facility]. ASked patient to call and cancel the original appt at [facility].

## 2015-06-25 ENCOUNTER — Telehealth: Payer: Self-pay

## 2015-06-25 NOTE — Telephone Encounter (Signed)
Recommend surgeon who performs surgery to prescribe pain med as needed. Continue naprosyn 500mg  BID, may add tylenol 1000mg  bid.  If not effective rec eval in office for further management.
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
## 2015-06-25 NOTE — Telephone Encounter (Signed)
Patient notified. He said he didn't even know who was doing the surgery or where he was supposed to go and wanted me to find out. I asked him if the procedure had even been scheduled and he said yes, but he didn't know where because he was confused after talking to the person that scheduled him. I told him to call them as soon as he got off the phone with me to find out exactly where he was supposed to go because if he showed up at the wrong place, his procedure may not be able to be done. He said he would find out for sure and ask about the meds.

## 2015-06-28 ENCOUNTER — Ambulatory Visit: Payer: Self-pay | Admitting: General Surgery

## 2015-06-28 ENCOUNTER — Other Ambulatory Visit: Payer: Self-pay | Admitting: Internal Medicine

## 2015-06-28 NOTE — Telephone Encounter (Signed)
Agree with advise given. He can continue Naprosyn and Tylenol. Other pain meds should come from surgeon.

## 2015-11-26 ENCOUNTER — Encounter (HOSPITAL_COMMUNITY): Payer: Self-pay | Admitting: Emergency Medicine

## 2015-11-26 ENCOUNTER — Emergency Department (HOSPITAL_COMMUNITY)
Admission: EM | Admit: 2015-11-26 | Discharge: 2015-11-26 | Disposition: A | Discharge status: Home / Self Care | Payer: No Typology Code available for payment source | Attending: Physician Assistant | Admitting: Physician Assistant

## 2015-11-26 DIAGNOSIS — F17210 Nicotine dependence, cigarettes, uncomplicated: Secondary | ICD-10-CM | POA: Diagnosis not present

## 2015-11-26 DIAGNOSIS — J069 Acute upper respiratory infection, unspecified: Secondary | ICD-10-CM | POA: Diagnosis not present

## 2015-11-26 DIAGNOSIS — R05 Cough: Secondary | ICD-10-CM | POA: Diagnosis present

## 2015-11-26 LAB — MONONUCLEOSIS SCREEN: MONO SCREEN: NEGATIVE

## 2015-11-26 LAB — RAPID STREP SCREEN (MED CTR MEBANE ONLY): Streptococcus, Group A Screen (Direct): NEGATIVE

## 2015-11-26 MED ORDER — AMOXICILLIN 500 MG PO CAPS: 500.0000 mg | ORAL_CAPSULE | Freq: Two times a day (BID) | ORAL | 0 refills | Status: DC

## 2015-11-26 MED ORDER — ACETAMINOPHEN 325 MG PO TABS
650.0000 mg | ORAL_TABLET | Freq: Once | ORAL | Status: AC | PRN
Start: 2015-11-26 — End: 2015-11-26
  Administered 2015-11-26: 650 mg via ORAL
  Filled 2015-11-26: qty 2

## 2015-11-26 NOTE — ED Triage Notes (Signed)
Per pt, states sore throat, congestion and body aches for a couple of days
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
## 2015-11-26 NOTE — ED Notes (Signed)
I attempted to collect blood twice

## 2015-11-26 NOTE — Discharge Instructions (Signed)
Contact a health care provider if: °You are getting worse rather than better. °Your symptoms are not controlled by medicine. °You have chills. °You have worsening shortness of breath. °You have brown or red mucus. °You have yellow or brown nasal discharge. °You have pain in your face, especially when you bend forward. °You have a fever. °You have swollen neck glands. °You have pain while swallowing. °You have white areas in the back of your throat. °Get help right away if: °You have severe or persistent: °Headache. °Ear pain. °Sinus pain. °Chest pain. °You have chronic lung disease and any of the following: °Wheezing. °Prolonged cough. °Coughing up blood. °A change in your usual mucus. °You have a stiff neck. °You have changes in your: °Vision. °Hearing. °Thinking. °Mood. °

## 2015-11-28 LAB — CULTURE, GROUP A STREP (THRC)

## 2016-04-29 IMAGING — CR DG FOOT COMPLETE 3+V*L*
1 series · 3 of 3 positions shown · non-contrast
Comparison: None.

CLINICAL DATA: Twisted ankle.  Foot pain .

EXAM:
LEFT FOOT - COMPLETE 3+ VIEW

[Series 1: AP · left · 3 of 3 slices shown]
[im 1/3]
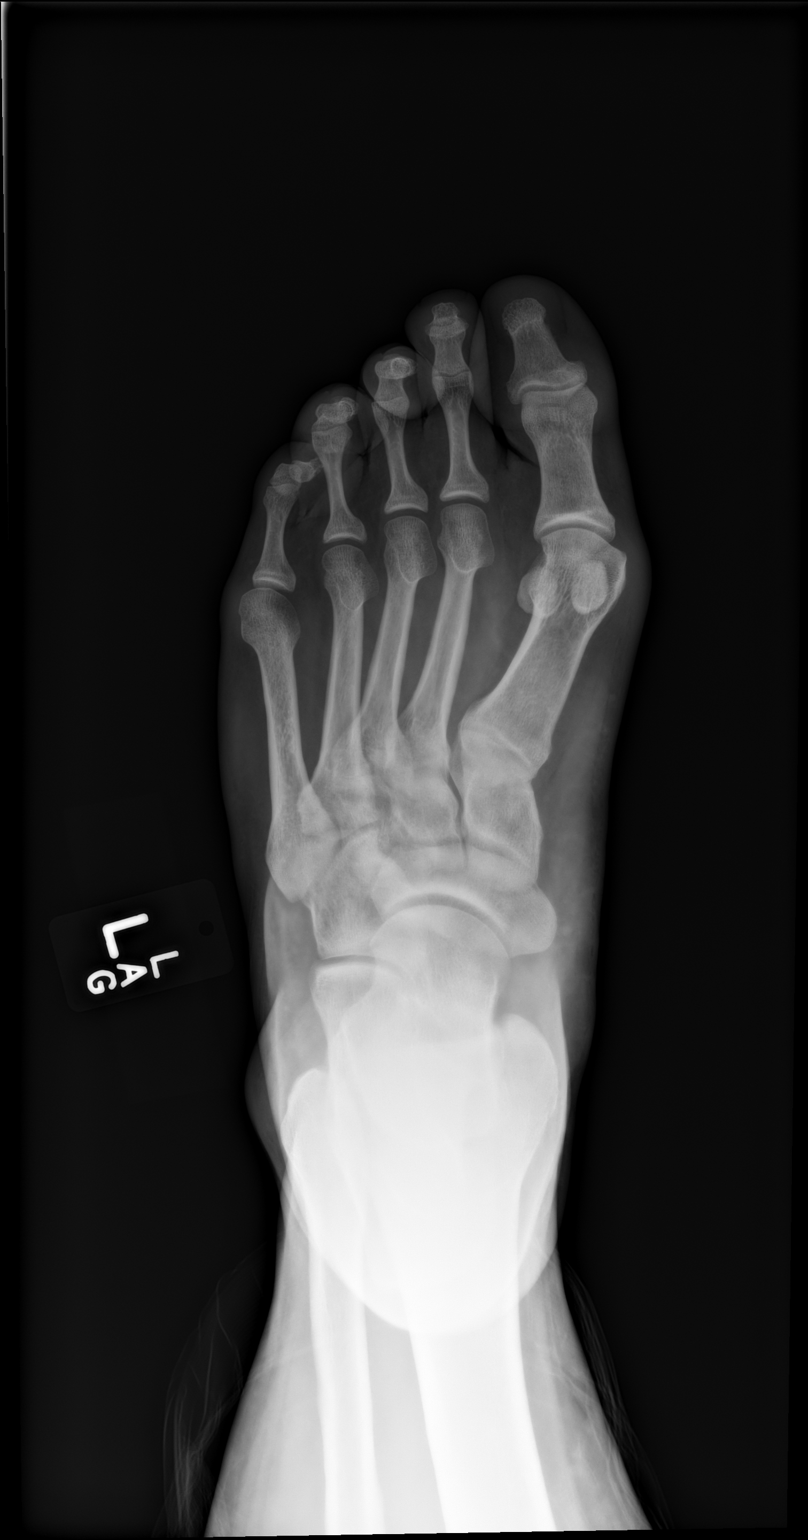
[im 2/3]
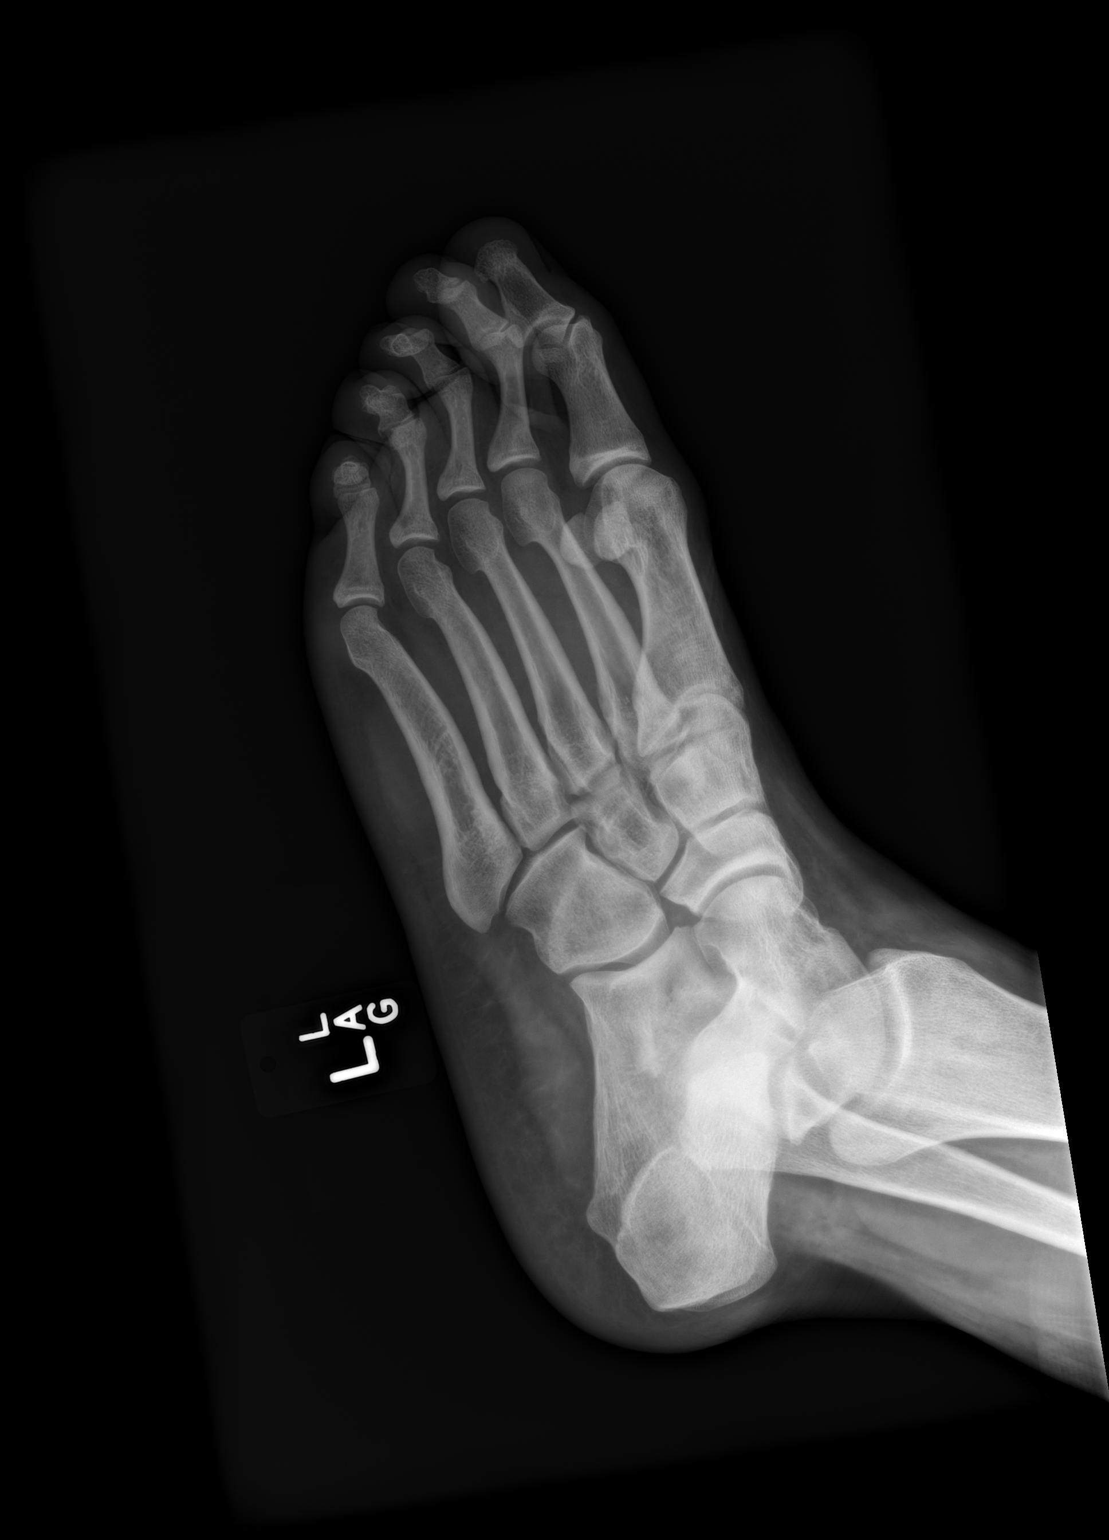
[im 3/3]
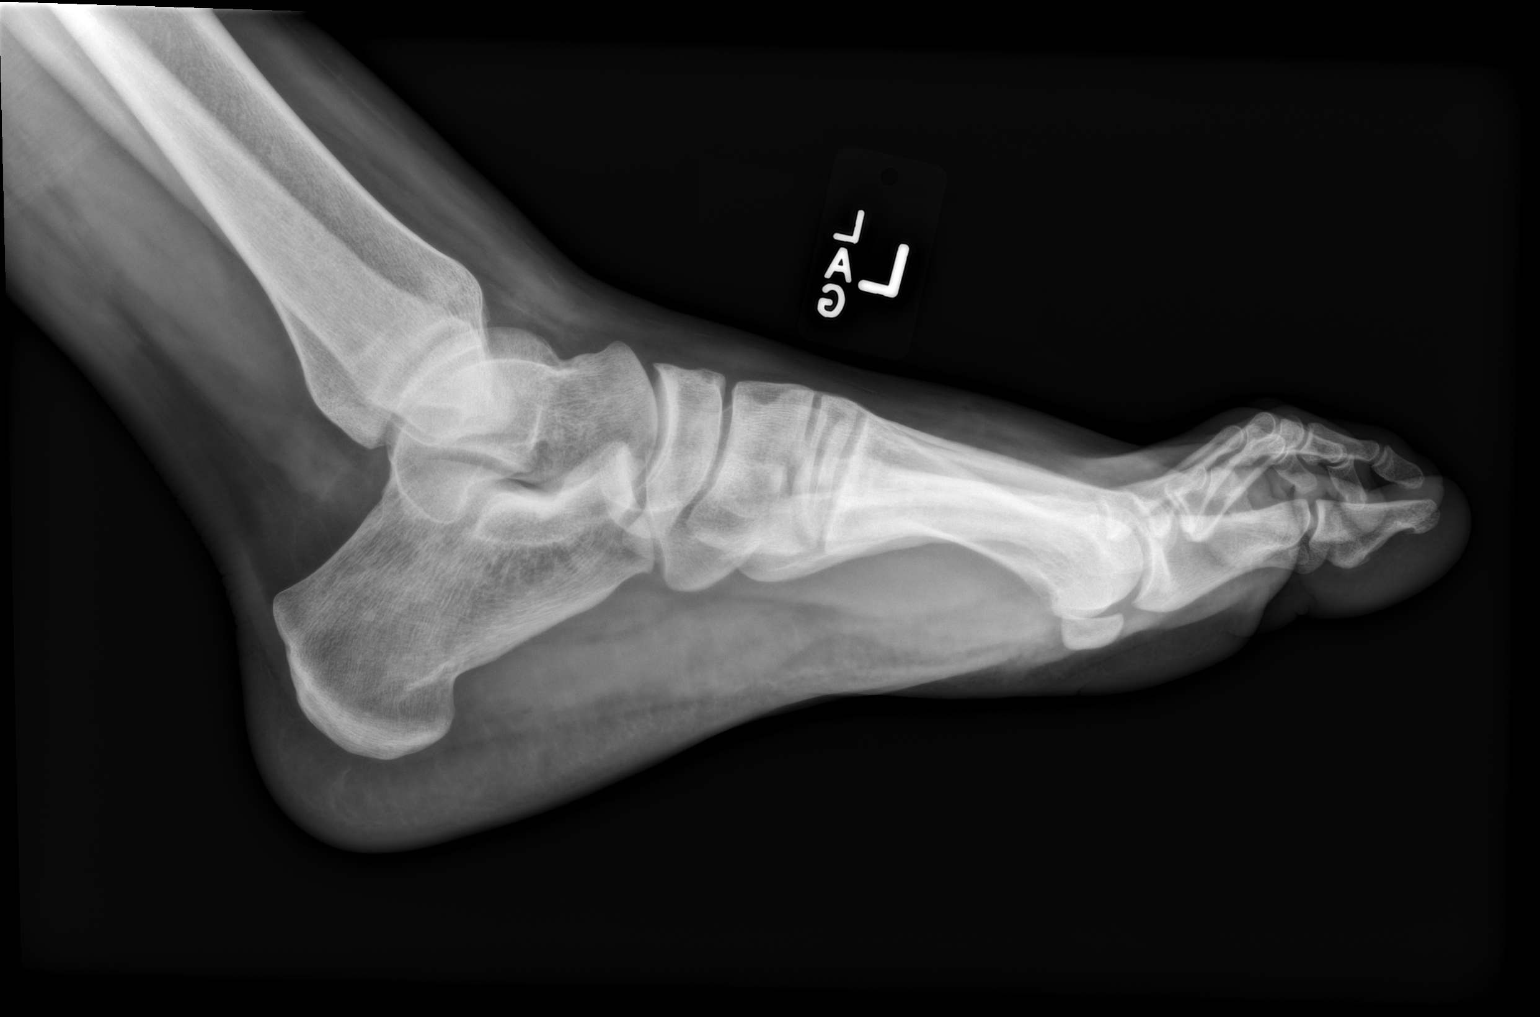

[3 of 3 positions shown; findings below may reference images not displayed]

FINDINGS: There is no evidence of fracture or dislocation. There is no
evidence of arthropathy or other focal bone abnormality.
IMPRESSION: Negative.

## 2017-08-06 IMAGING — US US EXTREM LOW*R* LIMITED
1 series · 14 of 25 positions shown · non-contrast
Comparison: None.

CLINICAL DATA: Right upper, lateral calf mass. The patient fell on
05/05/2015, injuring the right ankle

EXAM:
ULTRASOUND RIGHT LOWER EXTREMITY LIMITED
TECHNIQUE: Ultrasound examination of the lower extremity soft tissues was
performed in the area of clinical concern.

[Series 1: us extrem low*right* limited · 0.07mm/px · 14 of 62 slices shown]
[im 1/62]
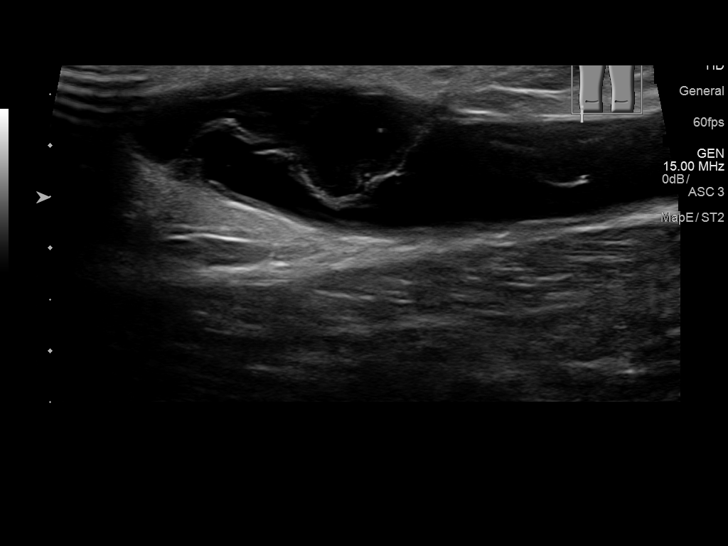
[im 6/62]
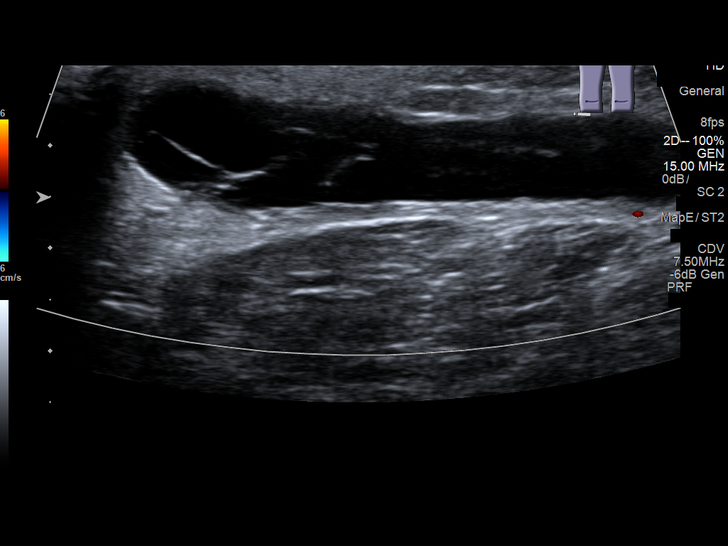
[im 11/62]
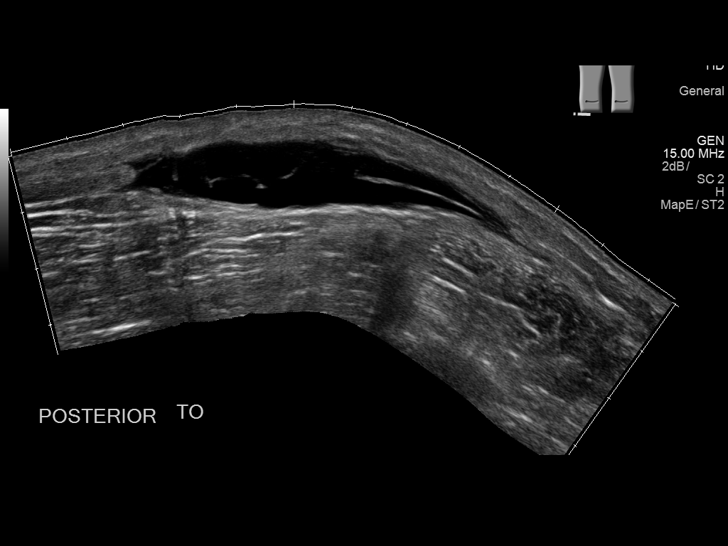
[im 16/62]
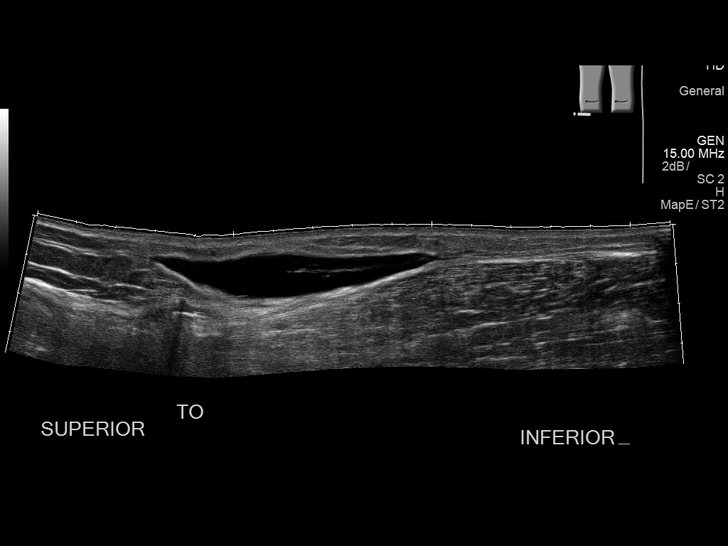
[im 21/62]
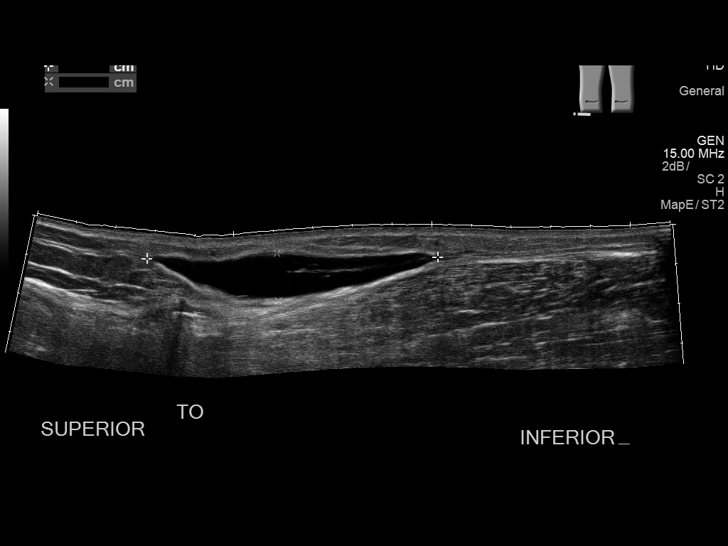
[im 23/62]
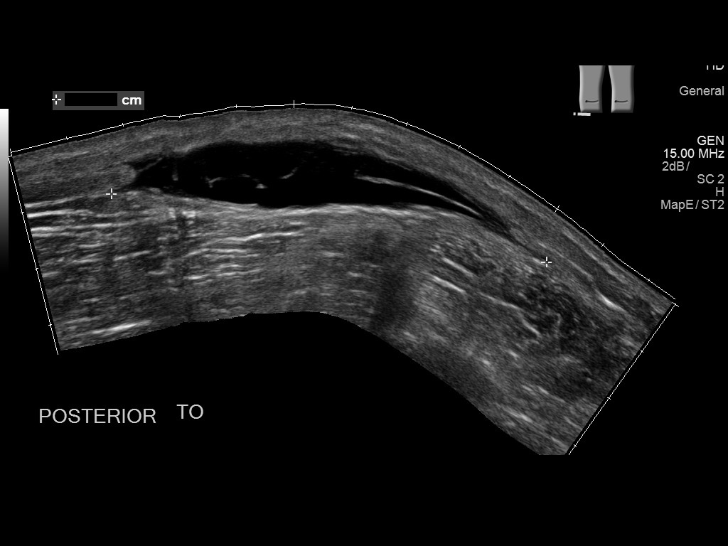
[im 28/62]
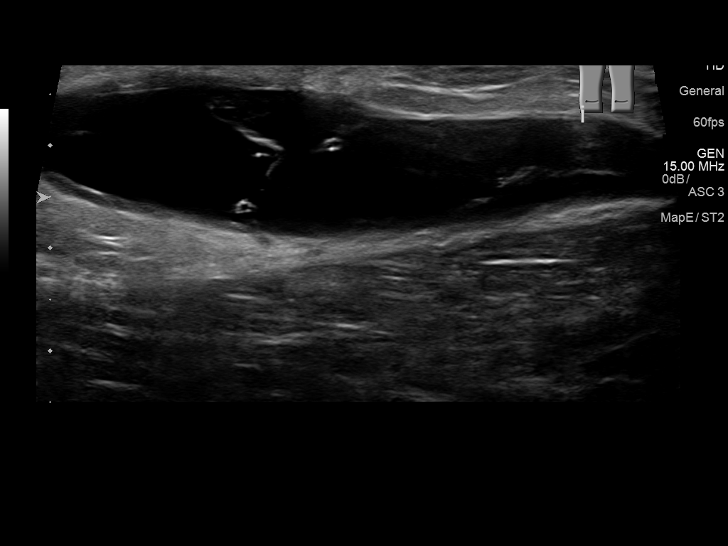
[im 34/62]
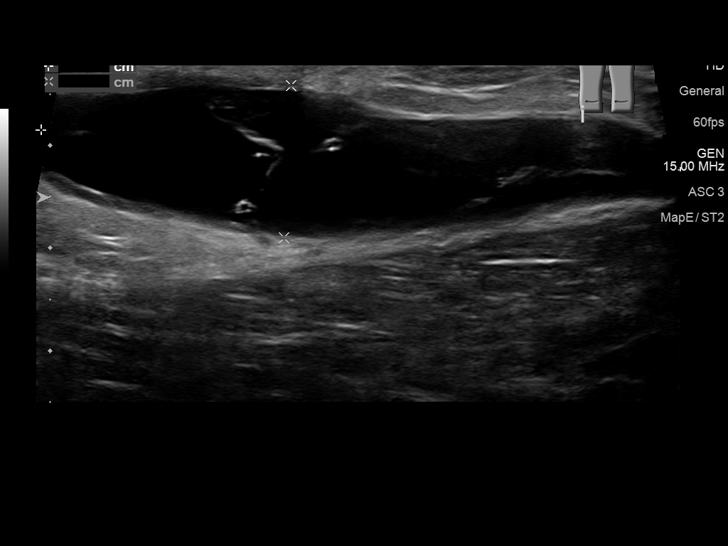
[im 39/62]
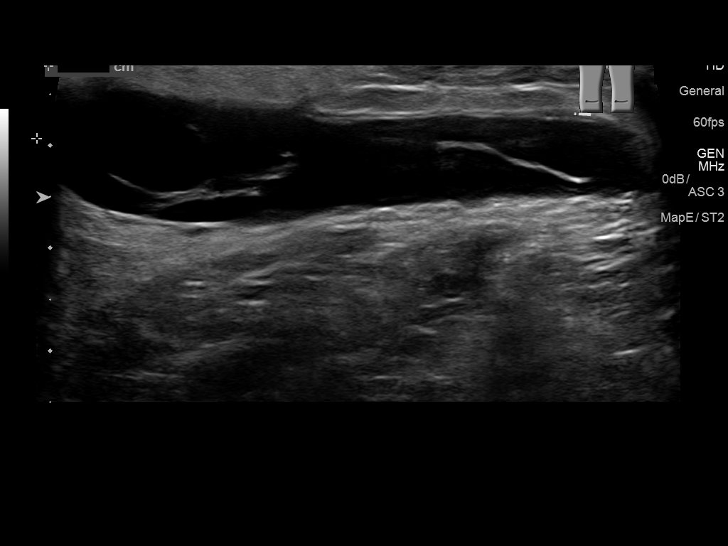
[im 41/62]
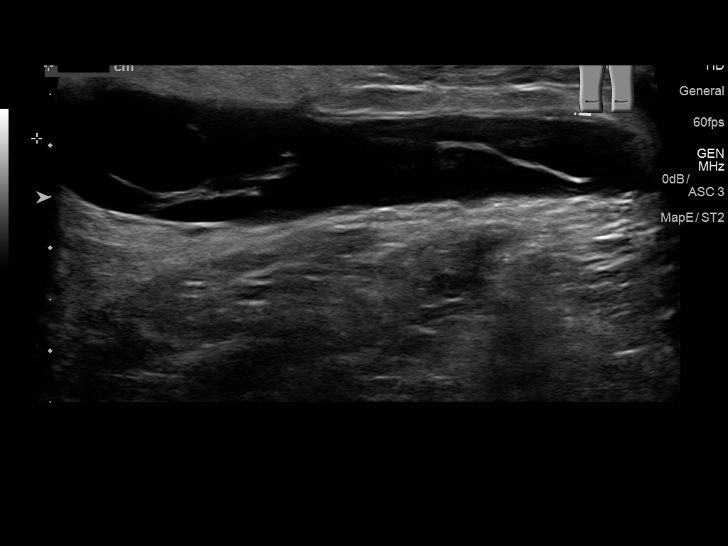
[im 46/62]
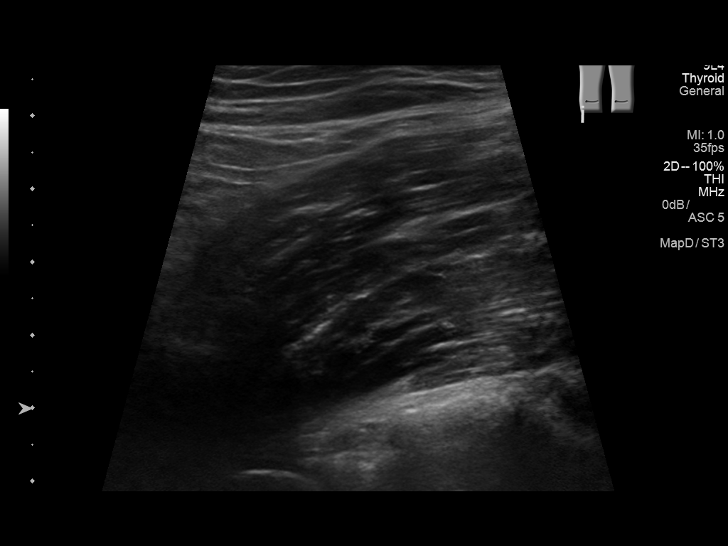
[im 51/62]
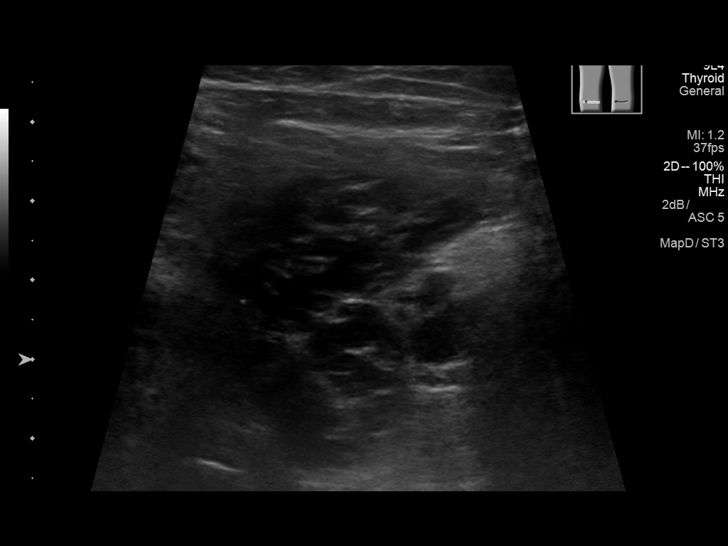
[im 56/62]
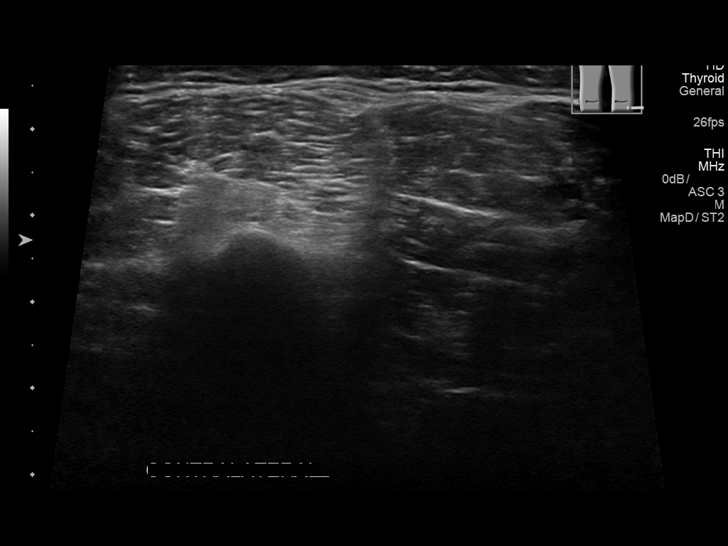
[im 62/62]
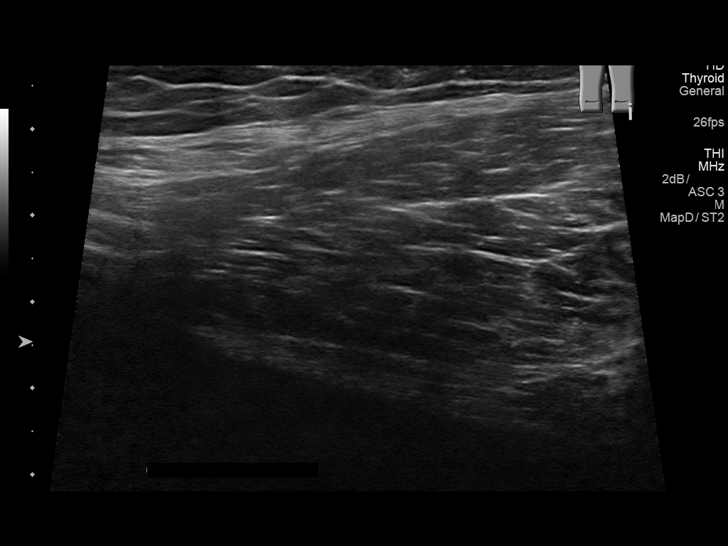

[14 of 25 positions shown; findings below may reference images not displayed]

FINDINGS: There is a superficial, elongated fluid collection containing mildly
thickened internal septations. No internal blood flow was seen with
color Doppler. This is located between the muscle and subcutaneous
fat. This measures approximately 7.6 x 6.2 x 1.5 cm in maximum
dimensions.
IMPRESSION: 7.6 x 6.2 x 1.5 cm superficial fluid collection containing mildly
thickened internal septations, corresponding to the palpable mass.
Differential considerations include a liquefied hematoma and
inferior, superficial extension of a popliteal cyst.

## 2017-11-05 ENCOUNTER — Encounter (HOSPITAL_BASED_OUTPATIENT_CLINIC_OR_DEPARTMENT_OTHER): Payer: Self-pay

## 2017-11-05 ENCOUNTER — Emergency Department (HOSPITAL_BASED_OUTPATIENT_CLINIC_OR_DEPARTMENT_OTHER)
Admission: EM | Admit: 2017-11-05 | Discharge: 2017-11-05 | Disposition: A | Discharge status: Home / Self Care | Payer: No Typology Code available for payment source | Attending: Emergency Medicine | Admitting: Emergency Medicine

## 2017-11-05 ENCOUNTER — Other Ambulatory Visit: Payer: Self-pay

## 2017-11-05 DIAGNOSIS — F17210 Nicotine dependence, cigarettes, uncomplicated: Secondary | ICD-10-CM | POA: Insufficient documentation

## 2017-11-05 DIAGNOSIS — B9789 Other viral agents as the cause of diseases classified elsewhere: Secondary | ICD-10-CM

## 2017-11-05 DIAGNOSIS — G8929 Other chronic pain: Secondary | ICD-10-CM

## 2017-11-05 DIAGNOSIS — K089 Disorder of teeth and supporting structures, unspecified: Secondary | ICD-10-CM

## 2017-11-05 DIAGNOSIS — R0981 Nasal congestion: Secondary | ICD-10-CM | POA: Insufficient documentation

## 2017-11-05 DIAGNOSIS — Y929 Unspecified place or not applicable: Secondary | ICD-10-CM | POA: Insufficient documentation

## 2017-11-05 DIAGNOSIS — J45909 Unspecified asthma, uncomplicated: Secondary | ICD-10-CM | POA: Insufficient documentation

## 2017-11-05 DIAGNOSIS — Y999 Unspecified external cause status: Secondary | ICD-10-CM | POA: Insufficient documentation

## 2017-11-05 DIAGNOSIS — X58XXXA Exposure to other specified factors, initial encounter: Secondary | ICD-10-CM | POA: Insufficient documentation

## 2017-11-05 DIAGNOSIS — J069 Acute upper respiratory infection, unspecified: Secondary | ICD-10-CM

## 2017-11-05 DIAGNOSIS — S025XXA Fracture of tooth (traumatic), initial encounter for closed fracture: Secondary | ICD-10-CM

## 2017-11-05 DIAGNOSIS — Y939 Activity, unspecified: Secondary | ICD-10-CM | POA: Insufficient documentation

## 2017-11-05 MED ORDER — ALBUTEROL SULFATE HFA 108 (90 BASE) MCG/ACT IN AERS
1.0000 | INHALATION_SPRAY | Freq: Four times a day (QID) | RESPIRATORY_TRACT | 0 refills | Status: AC | PRN
Start: 2017-11-05 — End: ?

## 2017-11-05 MED ORDER — PENICILLIN V POTASSIUM 500 MG PO TABS: 500.0000 mg | ORAL_TABLET | Freq: Four times a day (QID) | ORAL | 0 refills | Status: AC

## 2017-11-05 NOTE — ED Triage Notes (Signed)
C/o URI sx and right lower toothache-NAD-steady gait
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
## 2017-11-05 NOTE — Discharge Instructions (Addendum)
Your symptoms are most likely from a virus that has caused an upper respiratory infection and cough. A viral illness typically peaks on day 2-3 and resolves after one week.    The main treatment approach for a viral upper respiratory infection is to treat the symptoms, support your immune system and prevent spread of illness.   Stay well-hydrated. Rest. You can use over the counter medications to help with symptoms: 600 mg ibuprofen (motrin, aleve, advil) or acetaminophen (tylenol) every 6 hours, around the clock to help with associated fevers, sore throat, headaches, generalized body aches and malaise. You can safely combine these two medicines and take them every 8 hours for more symptom control. Oxymetazoline (afrin) intranasal spray once daily for no more than 3 days to help with congestion, after 3 days you can switch to another over-the-counter nasal steroid spray such as fluticasone (flonase) Allergy medication (loratadine, cetirizine, etc) and phenylephrine (sudafed) help with nasal congestion, runny nose and postnasal drip.   Dextromethorphan (Delsym) to suppress cough at night time or work  Guaifenesin (mucinex) to help cough up mucus Wash your hands often to prevent spread.   A viral upper respiratory infection can also worsen and progress into pneumonia.  Return to ER if symptoms worsen, persist and you develop persistent fevers, chest pain, shortness of breath  Penicillin is for a dental infection. You have pain due to broken tooth. This will continue to hurt until you are seen by a dentist.  Pain is likely from broken tooth/exposed nerve endings from cavities.  There are no signs of superimposed infection or collection of pus.  See dental resources attached.  Additionally, I have given you Dr. Mayford Knife and Dr. Leanord Asal contact information, they frequently try to accommodate patients from the ER.  Return to the ER for fevers, facial or gum line swelling, redness, pus.

## 2018-03-23 IMAGING — CR DG ANKLE COMPLETE 3+V*R*
3 series · 3 of 3 positions shown · non-contrast
Comparison: 06/23/2012

CLINICAL DATA: Fall last night with right ankle pain. Initial
encounter.

EXAM:
RIGHT ANKLE - COMPLETE 3+ VIEW

[t ankle joint ap right]
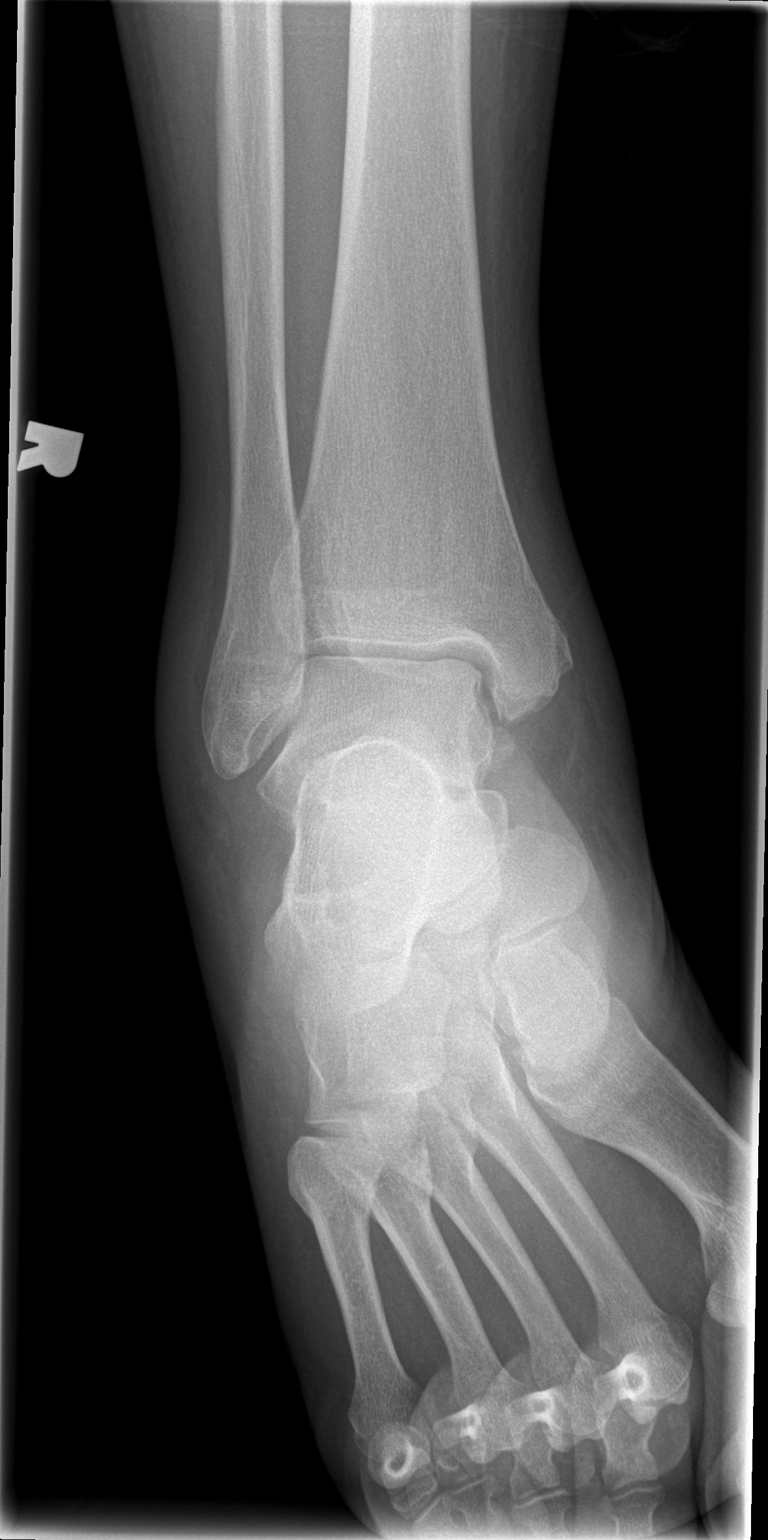

[t ankle joint oblique right]
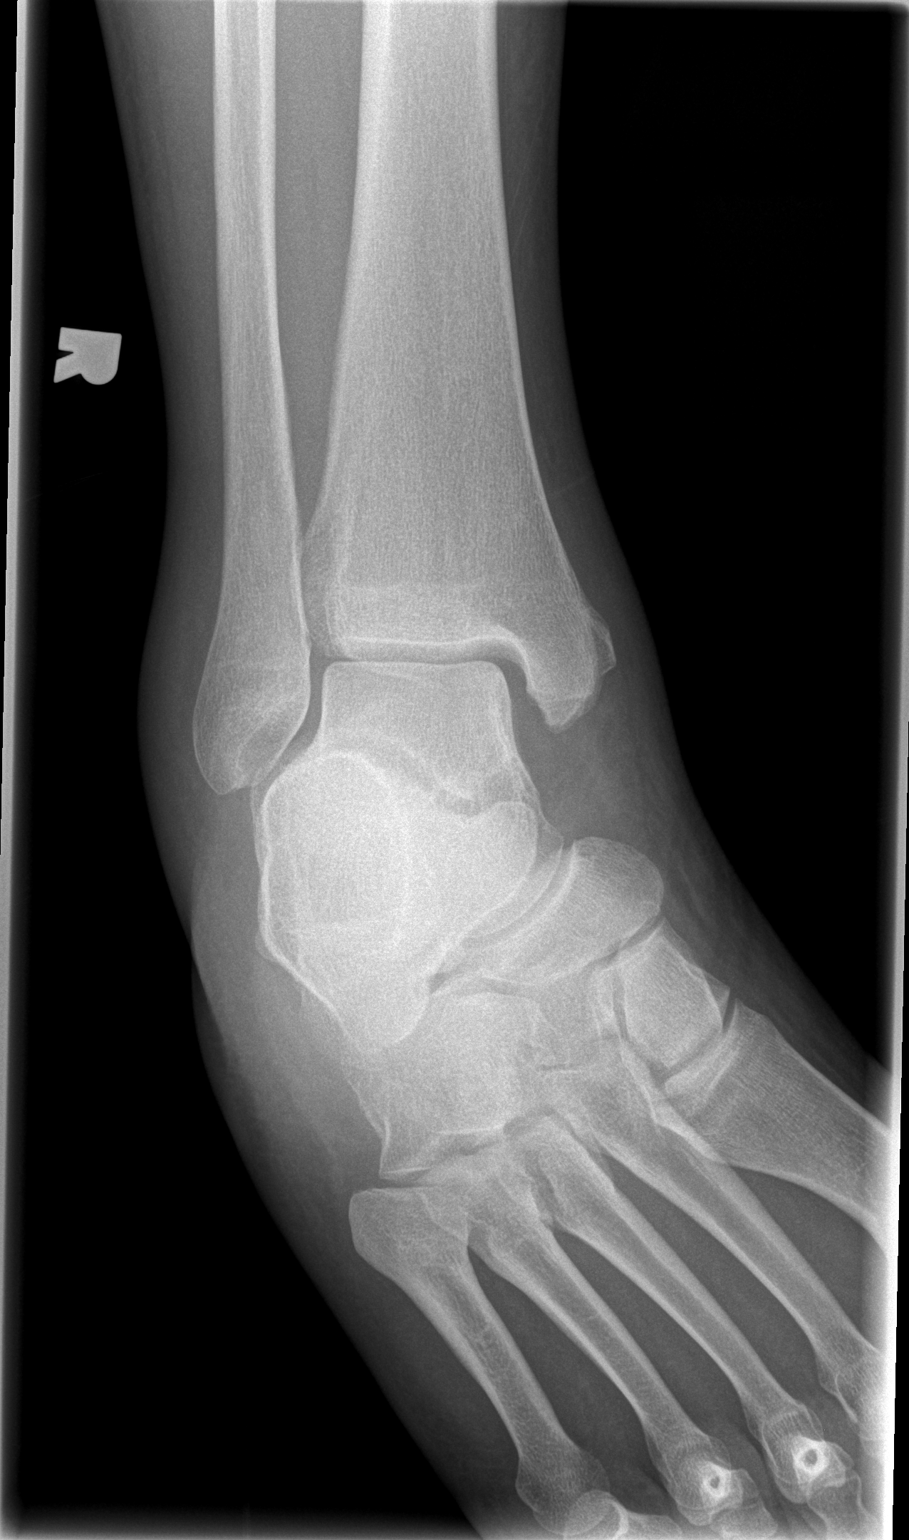

[t ankle joint lat right]
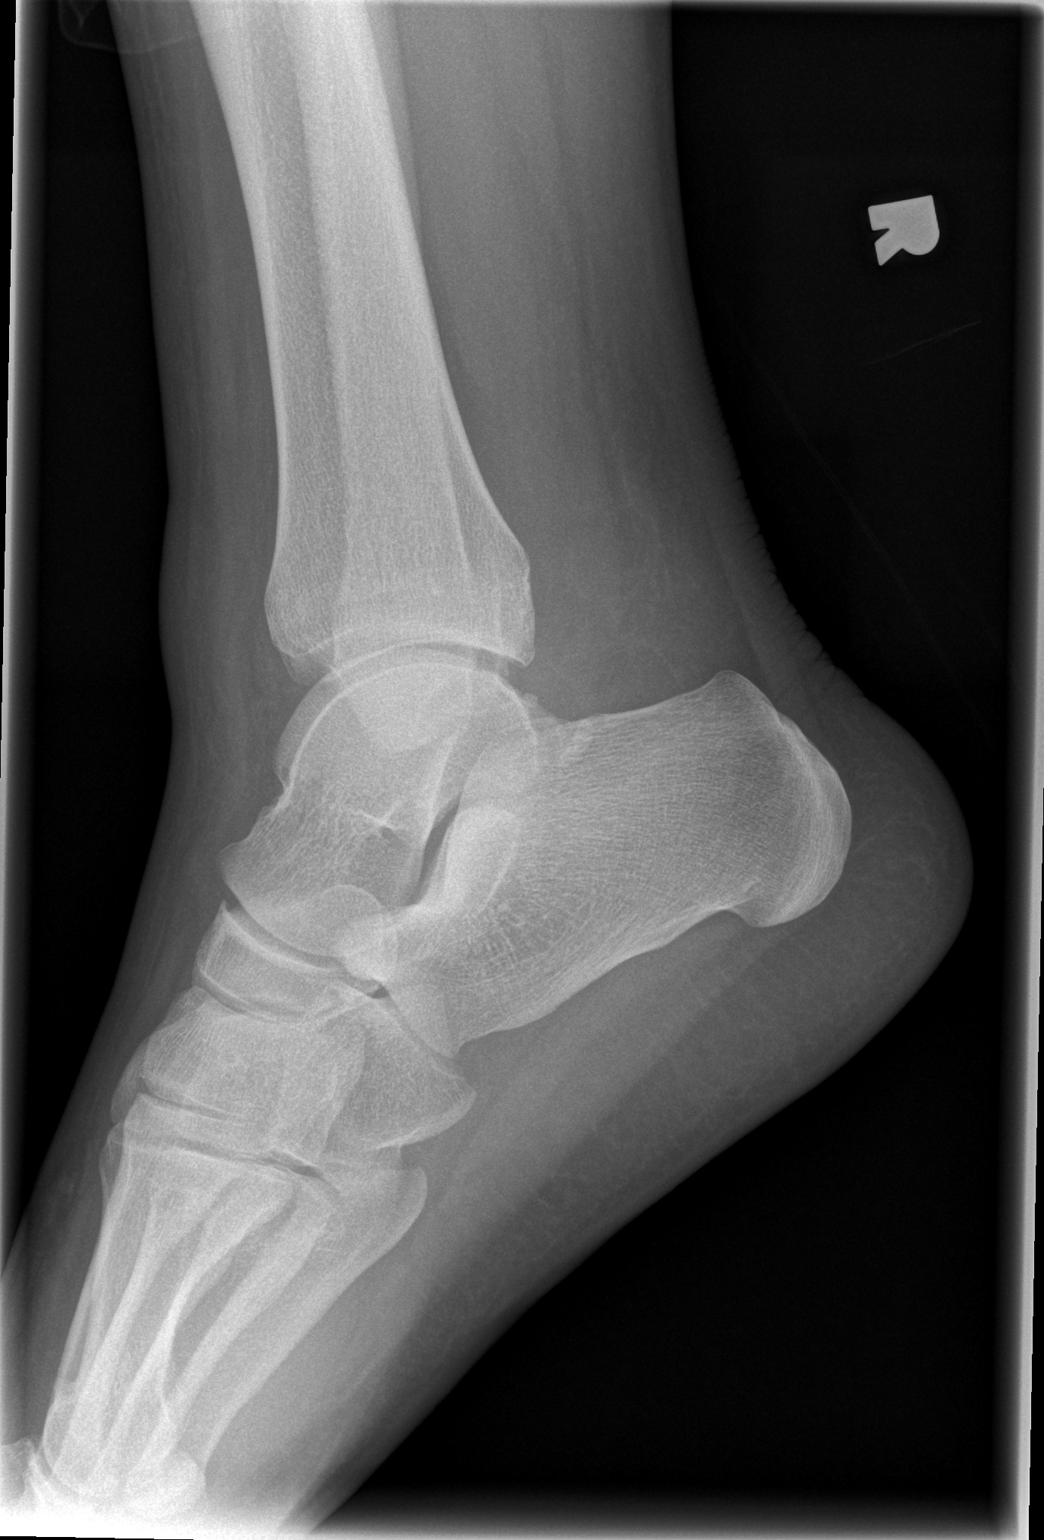

[3 of 3 positions shown; findings below may reference images not displayed]

FINDINGS: Soft tissue swelling. There is no evidence of fracture, dislocation,
or joint effusion.
IMPRESSION: Soft tissue swelling without fracture.

## 2021-04-06 DIAGNOSIS — Z9889 Other specified postprocedural states: Secondary | ICD-10-CM | POA: Insufficient documentation

## 2021-04-06 DIAGNOSIS — H35412 Lattice degeneration of retina, left eye: Secondary | ICD-10-CM | POA: Insufficient documentation

## 2021-04-06 DIAGNOSIS — H2511 Age-related nuclear cataract, right eye: Secondary | ICD-10-CM | POA: Insufficient documentation

## 2021-04-28 ENCOUNTER — Other Ambulatory Visit: Payer: Self-pay

## 2021-04-28 ENCOUNTER — Encounter (HOSPITAL_BASED_OUTPATIENT_CLINIC_OR_DEPARTMENT_OTHER): Payer: Self-pay | Admitting: Emergency Medicine

## 2021-04-28 ENCOUNTER — Emergency Department (HOSPITAL_BASED_OUTPATIENT_CLINIC_OR_DEPARTMENT_OTHER)
Admission: EM | Admit: 2021-04-28 | Discharge: 2021-04-28 | Disposition: A | Discharge status: Home / Self Care | Payer: No Typology Code available for payment source | Attending: Emergency Medicine | Admitting: Emergency Medicine

## 2021-04-28 DIAGNOSIS — H6691 Otitis media, unspecified, right ear: Secondary | ICD-10-CM | POA: Insufficient documentation

## 2021-04-28 DIAGNOSIS — J029 Acute pharyngitis, unspecified: Secondary | ICD-10-CM | POA: Insufficient documentation

## 2021-04-28 DIAGNOSIS — H6690 Otitis media, unspecified, unspecified ear: Secondary | ICD-10-CM

## 2021-04-28 LAB — GROUP A STREP BY PCR: Group A Strep by PCR: NOT DETECTED

## 2021-04-28 MED ORDER — AMOXICILLIN 500 MG PO CAPS: 500.0000 mg | ORAL_CAPSULE | Freq: Three times a day (TID) | ORAL | 0 refills | Status: AC

## 2021-04-28 NOTE — ED Notes (Signed)
EMT-P provided AVS using Teachback Method. Patient verbalizes understanding of Discharge Instructions. Opportunity for Questioning and Answers were provided by EMT-P. Patient Discharged from ED.  ? ?

## 2021-04-28 NOTE — ED Triage Notes (Signed)
Patient c/o sore throat, right ear pain and feeling tired onset of Friday. Denies any sick contacts.  ?

## 2021-04-28 NOTE — ED Provider Notes (Signed)
?[facility] EMERGENCY DEPT ?Provider Note ? ? ?CSN: [id number] ?Arrival date & time: 04/28/21  1358 ? ?  ? ?History ? ?Chief Complaint  ?Patient presents with  ? Sore Throat  ? ? ?Dustin Day is a 31 y.o. male. ? ?Pt complains of a sore throat and right ear pain.  Pt works in a school.  No known exposure to strep.  Pt denies fever or chills. No cough or congestion  ? ?The history is provided by the patient. No language interpreter was used.  ?Sore Throat ?This is a new problem. The current episode started yesterday. The problem occurs constantly. The problem has been gradually worsening. Pertinent negatives include no chest pain, no abdominal pain and no headaches. Nothing aggravates the symptoms. Nothing relieves the symptoms. He has tried nothing for the symptoms. The treatment provided no relief.  ? ?  ? ?Home Medications ?Prior to Admission medications   ?Medication Sig Start Date End Date Taking? Authorizing Provider  ?albuterol (PROVENTIL HFA;VENTOLIN HFA) 108 (90 Base) MCG/ACT inhaler Inhale 1-2 puffs into the lungs every 6 (six) hours as needed for wheezing or shortness of breath. 11/05/17   Liberty Handy, PA-C  ?amoxicillin (AMOXIL) 500 MG capsule Take 1 capsule (500 mg total) by mouth 2 (two) times daily. 11/26/15   Alvina Chou, PA  ?naproxen (NAPROSYN) 375 MG tablet Take 1 tablet (375 mg total) by mouth 2 (two) times daily. 05/06/15   Palumbo, April, MD  ?Oxycodone HCl 10 MG TABS Take 10 mg by mouth every 8 (eight) hours as needed.  05/21/15   [provider]  ?   ? ?Allergies    ?Vicodin [hydrocodone-acetaminophen]   ? ?Review of Systems   ?Review of Systems  ?Cardiovascular:  Negative for chest pain.  ?Gastrointestinal:  Negative for abdominal pain.  ?Neurological:  Negative for headaches.  ?All other systems reviewed and are negative. ? ?Physical Exam ?Updated Vital Signs ?BP 136/90 (BP Location: Right Arm)   Pulse 67   Temp 97.7 ?F (36.5 ?C) (Oral)   Resp 17    Ht 6' (1.829 m)   Wt 113.4 kg   SpO2 100%   BMI 33.91 kg/m?  ?Physical Exam ?Vitals and nursing note reviewed.  ?Constitutional:   ?   General: He is not in acute distress. ?   Appearance: He is well-developed.  ?HENT:  ?   Head: Normocephalic and atraumatic.  ?   Right Ear: Tympanic membrane is erythematous.  ?   Left Ear: Tympanic membrane normal.  ?   Mouth/Throat:  ?   Mouth: Mucous membranes are moist.  ?   Pharynx: Pharyngeal swelling and posterior oropharyngeal erythema present.  ?   Tonsils: 2+ on the right. 2+ on the left.  ?Eyes:  ?   Conjunctiva/sclera: Conjunctivae normal.  ?Cardiovascular:  ?   Rate and Rhythm: Normal rate and regular rhythm.  ?   Heart sounds: No murmur heard. ?Pulmonary:  ?   Effort: Pulmonary effort is normal. No respiratory distress.  ?   Breath sounds: Normal breath sounds.  ?Abdominal:  ?   Palpations: Abdomen is soft.  ?   Tenderness: There is no abdominal tenderness.  ?Musculoskeletal:     ?   General: No swelling.  ?   Cervical back: Neck supple.  ?Skin: ?   General: Skin is warm and dry.  ?   Capillary Refill: Capillary refill takes less than 2 seconds.  ?Neurological:  ?   Mental Status: He is  alert.  ?Psychiatric:     ?   Mood and Affect: Mood normal.  ? ? ?ED Results / Procedures / Treatments   ?Labs ?(all labs ordered are listed, but only abnormal results are displayed) ?Labs Reviewed  ?GROUP A STREP BY PCR  ? ? ?EKG ?None ? ?Radiology ?No results found. ? ?Procedures ?Procedures  ? ? ?Medications Ordered in ED ?Medications - No data to display ? ?ED Course/ Medical Decision Making/ A&P ?  ?                        ?Medical Decision Making ?Risk ?Prescription drug management. ? ? ?MDM:  strep screen is negative.  Pt has an otitis media.  I will treat with amoxicillian  ? ? ? ? ? ? ? ?Final Clinical Impression(s) / ED Diagnoses ?Final diagnoses:  ?Acute otitis media, unspecified otitis media type  ? ? ?Rx / DC Orders ?ED Discharge Orders   ? ?      Ordered  ?   amoxicillin (AMOXIL) 500 MG capsule  3 times daily       ? 04/28/21 1602  ? ?  ?  ? ?  ? ?An After Visit Summary was printed and given to the patient. ? ?  ?Elson Areas, [place] ?04/28/21 1614 ? ?  ?Gwyneth Sprout, MD ?04/29/21 1522 ? ?

## 2021-04-28 NOTE — Discharge Instructions (Addendum)
Return if any problems.

## 2022-10-02 ENCOUNTER — Emergency Department (HOSPITAL_BASED_OUTPATIENT_CLINIC_OR_DEPARTMENT_OTHER): Payer: Medicaid Other

## 2022-10-02 ENCOUNTER — Encounter (HOSPITAL_BASED_OUTPATIENT_CLINIC_OR_DEPARTMENT_OTHER): Payer: Self-pay

## 2022-10-02 ENCOUNTER — Emergency Department (HOSPITAL_BASED_OUTPATIENT_CLINIC_OR_DEPARTMENT_OTHER)
Admission: EM | Admit: 2022-10-02 | Discharge: 2022-10-02 | Disposition: A | Discharge status: Home / Self Care | Payer: Medicaid Other | Attending: Emergency Medicine | Admitting: Emergency Medicine

## 2022-10-02 ENCOUNTER — Other Ambulatory Visit: Payer: Self-pay

## 2022-10-02 DIAGNOSIS — R6884 Jaw pain: Secondary | ICD-10-CM | POA: Diagnosis present

## 2022-10-02 DIAGNOSIS — R519 Headache, unspecified: Secondary | ICD-10-CM | POA: Insufficient documentation

## 2022-10-02 DIAGNOSIS — Z202 Contact with and (suspected) exposure to infections with a predominantly sexual mode of transmission: Secondary | ICD-10-CM | POA: Insufficient documentation
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
## 2022-10-02 NOTE — Discharge Instructions (Signed)
You were seen in the emergency department today for facial pain and STI testing.  Your CT imaging of your head and your face was unremarkable with no findings of any bleeding or acute fractures as noted.  I would suggest managing her pain over the next few days by eating softer bland foods reduce the amount of fatigue on her jaw.  You can take Tylenol or ibuprofen as needed for this pain.  Your STI testing is also pending at this time.  Given that you are currently asymptomatic, I would not recommend initiating treatment however if your tests are positive, I would suggest strongly getting treated.  He did not develop any symptoms send your tests are negative, please seek out reevaluation.  If any symptoms worsen or new symptoms arise, return to the ER for evaluation.

## 2022-10-03 LAB — RPR: RPR Ser Ql: NONREACTIVE

## 2022-10-04 LAB — GC/CHLAMYDIA PROBE AMP (~~LOC~~) NOT AT ARMC
Chlamydia: NEGATIVE
Comment: NEGATIVE
Comment: NORMAL
Neisseria Gonorrhea: NEGATIVE

## 2022-10-04 LAB — HIV ANTIBODY (ROUTINE TESTING W REFLEX): HIV Screen 4th Generation wRfx: NONREACTIVE

## 2023-05-07 ENCOUNTER — Encounter (HOSPITAL_COMMUNITY): Payer: Self-pay

## 2023-05-07 ENCOUNTER — Other Ambulatory Visit: Payer: Self-pay

## 2023-05-07 ENCOUNTER — Emergency Department (HOSPITAL_COMMUNITY): Admission: EM | Admit: 2023-05-07 | Discharge: 2023-05-07 | Disposition: A | Discharge status: Home / Self Care

## 2023-05-07 DIAGNOSIS — U071 COVID-19: Secondary | ICD-10-CM | POA: Diagnosis not present

## 2023-05-07 DIAGNOSIS — J45909 Unspecified asthma, uncomplicated: Secondary | ICD-10-CM | POA: Insufficient documentation

## 2023-05-07 DIAGNOSIS — R509 Fever, unspecified: Secondary | ICD-10-CM | POA: Diagnosis present

## 2023-05-07 DIAGNOSIS — Z7951 Long term (current) use of inhaled steroids: Secondary | ICD-10-CM | POA: Diagnosis not present

## 2023-05-07 LAB — RESP PANEL BY RT-PCR (RSV, FLU A&B, COVID)  RVPGX2
Influenza A by PCR: NEGATIVE
Influenza B by PCR: NEGATIVE
Resp Syncytial Virus by PCR: NEGATIVE
SARS Coronavirus 2 by RT PCR: POSITIVE — AB

## 2023-05-07 MED ORDER — ALBUTEROL SULFATE HFA 108 (90 BASE) MCG/ACT IN AERS
1.0000 | INHALATION_SPRAY | Freq: Once | RESPIRATORY_TRACT | Status: AC
  Administered 2023-05-07: 1 via RESPIRATORY_TRACT
  Filled 2023-05-07: qty 6.7

## 2023-05-07 NOTE — ED Provider Notes (Signed)
 [place] EMERGENCY DEPARTMENT AT [facility] Provider Note   CSN: [id number] Arrival date & time: 05/07/23  1740     History  Chief Complaint  Patient presents with   Fever   Cough    Pt reports flu like symptoms.     Dustin Day is a 33 y.o. male with PMHx asthma, anxiety, depression who presents to ED concerned for cough, SOB, fever, congestion x2 days. Patient also requesting STD testing while in ED despite having any STD symptoms such as penile discharge. Patient is out of his albuterol  and requesting more.    Fever Associated symptoms: cough   Cough Associated symptoms: fever        Home Medications Prior to Admission medications   Medication Sig Start Date End Date Taking? Authorizing Provider  albuterol  (PROVENTIL  HFA;VENTOLIN  HFA) 108 (90 Base) MCG/ACT inhaler Inhale 1-2 puffs into the lungs every 6 (six) hours as needed for wheezing or shortness of breath. 11/05/17   Theodora Fish, PA-C  amoxicillin  (AMOXIL ) 500 MG capsule Take 1 capsule (500 mg total) by mouth 3 (three) times daily. 04/28/21   Sofia, Leslie K, PA-C  naproxen  (NAPROSYN ) 375 MG tablet Take 1 tablet (375 mg total) by mouth 2 (two) times daily. 05/06/15   Palumbo, April, MD  Oxycodone  HCl 10 MG TABS Take 10 mg by mouth every 8 (eight) hours as needed.  05/21/15   [provider]      Allergies    Vicodin [hydrocodone -acetaminophen ]    Review of Systems   Review of Systems  Constitutional:  Positive for fever.  Respiratory:  Positive for cough.     Physical Exam Updated Vital Signs BP 128/81   Pulse 78   Temp 98.5 F (36.9 C)   Resp 18   Ht 6' (1.829 m)   Wt 108.9 kg   SpO2 99%   BMI 32.56 kg/m  Physical Exam Vitals and nursing note reviewed.  Constitutional:      General: He is not in acute distress.    Appearance: He is not ill-appearing or toxic-appearing.  HENT:     Head: Normocephalic and atraumatic.     Mouth/Throat:     Mouth: Mucous membranes are  moist.     Pharynx: No oropharyngeal exudate or posterior oropharyngeal erythema.  Eyes:     General: No scleral icterus.       Right eye: No discharge.        Left eye: No discharge.     Conjunctiva/sclera: Conjunctivae normal.  Cardiovascular:     Rate and Rhythm: Normal rate and regular rhythm.     Pulses: Normal pulses.     Heart sounds: Normal heart sounds. No murmur heard. Pulmonary:     Effort: Pulmonary effort is normal. No respiratory distress.     Breath sounds: Normal breath sounds. No wheezing, rhonchi or rales.  Abdominal:     General: Abdomen is flat.  Musculoskeletal:     Right lower leg: No edema.     Left lower leg: No edema.  Skin:    General: Skin is warm and dry.     Findings: No rash.  Neurological:     General: No focal deficit present.     Mental Status: He is alert and oriented to person, place, and time. Mental status is at baseline.  Psychiatric:        Mood and Affect: Mood normal.        Behavior: Behavior normal.  ED Results / Procedures / Treatments   Labs (all labs ordered are listed, but only abnormal results are displayed) Labs Reviewed  RESP PANEL BY RT-PCR (RSV, FLU A&B, COVID)  RVPGX2 - Abnormal; Notable for the following components:      Result Value   SARS Coronavirus 2 by RT PCR POSITIVE (*)    All other components within normal limits  RPR  HIV ANTIBODY (ROUTINE TESTING W REFLEX)  GC/CHLAMYDIA PROBE AMP ([place]) NOT AT [facility]    EKG None  Radiology No results found.  Procedures Procedures    Medications Ordered in ED Medications  albuterol  (VENTOLIN  HFA) 108 (90 Base) MCG/ACT inhaler 1 puff (1 puff Inhalation Given 05/07/23 2144)    ED Course/ Medical Decision Making/ A&P                                 Medical Decision Making Risk Prescription drug management.    This patient presents to the ED for concern of cough, congestion, SOB, fatigue, this involves an extensive number of treatment options, and is  a complaint that carries with it a high risk of complications and morbidity.  The differential diagnosis includes Flu/COVID/RSV, strep pharyngitis, sinusitis, peritonsillar abscess, retropharyngeal abscess, pneumonia, meningitis.   Co morbidities that complicate the patient evaluation  asthma, anxiety, depression   Additional history obtained:  No PCP in chart.  Will refer to community clinic.   Problem List / ED Course / Critical interventions / Medication management  Patient presents to ED concern for cough, congestion, SOB, fever, fatigue x2 days.  Patient took a dose of Tylenol  earlier this morning.  Patient stated that he is out of his albuterol  inhaler and is requesting new prescription. Physical exam reassuring.  Patient afebrile with stable vitals. I Ordered, and personally interpreted labs.  Respiratory panel positive for COVID.  STD panel pending. Shared all results with patient.  Answered all questions.  Patient stating that he is feeling a lot better after he received his albuterol  inhaler.  Patient does not want chest x-ray at this time.  Recommended following up with PCP. Patient educated on following up with the Department of Public Health for his STD panel results.  Patient verbalized understanding of plan. Patient leaving before discharge paperwork was able to be printed out.  I have reviewed the patients home medicines and have made adjustments as needed The patient has been appropriately medically screened and/or stabilized in the ED. I have low suspicion for any other emergent medical condition which would require further screening, evaluation or treatment in the ED or require inpatient management. At time of discharge the patient is hemodynamically stable and in no acute distress. I have discussed work-up results and diagnosis with patient and answered all questions. Patient is agreeable with discharge plan. We discussed strict return precautions for returning to the  emergency department and they verbalized understanding.     Social Determinants of Health:  none         Final Clinical Impression(s) / ED Diagnoses Final diagnoses:  COVID    Rx / DC Orders ED Discharge Orders     None         [place], [place] 05/07/23 2202    Rolinda Climes, DO 05/07/23 2345

## 2023-05-07 NOTE — ED Provider Triage Note (Signed)
 Emergency Medicine Provider Triage Evaluation Note  Dustin Day , a 33 y.o. male  was evaluated in triage.  Pt complains of cough and congestion.  Pt also request std testing    Review of Systems  Positive: Cough and rash in groin area Negative: fever  Physical Exam  BP 125/86   Pulse 76   Temp 98.2 F (36.8 C) (Oral)   Resp 18   Ht 6' (1.829 m)   Wt 108.9 kg   SpO2 100%   BMI 32.56 kg/m  Gen:   Awake, no distress   Resp:  Normal effort  MSK:   Moves extremities without difficulty  Other:    Medical Decision Making  Medically screening exam initiated at 6:01 PM.  Appropriate orders placed.  Dustin Day was informed that the remainder of the evaluation will be completed by another provider, this initial triage assessment does not replace that evaluation, and the importance of remaining in the ED until their evaluation is complete.     Sandi Crosby, PA-C 05/07/23 9562

## 2023-05-07 NOTE — ED Triage Notes (Signed)
 PT reports flu like symptoms x2 days. sob, sweating , fever. Denies known exposure to illness. Endorses overall feeling fatigue

## 2023-05-08 LAB — RPR: RPR Ser Ql: NONREACTIVE

## 2023-05-08 LAB — GC/CHLAMYDIA PROBE AMP (~~LOC~~) NOT AT ARMC
Chlamydia: NEGATIVE
Comment: NEGATIVE
Comment: NORMAL
Neisseria Gonorrhea: NEGATIVE

## 2023-05-08 LAB — HIV ANTIBODY (ROUTINE TESTING W REFLEX): HIV Screen 4th Generation wRfx: NONREACTIVE

## 2023-06-04 ENCOUNTER — Other Ambulatory Visit: Payer: Self-pay

## 2023-06-04 ENCOUNTER — Emergency Department (HOSPITAL_COMMUNITY)
Admission: EM | Admit: 2023-06-04 | Discharge: 2023-06-04 | Disposition: A | Discharge status: Home / Self Care | Attending: Emergency Medicine | Admitting: Emergency Medicine

## 2023-06-04 DIAGNOSIS — Z23 Encounter for immunization: Secondary | ICD-10-CM | POA: Insufficient documentation

## 2023-06-04 DIAGNOSIS — L0291 Cutaneous abscess, unspecified: Secondary | ICD-10-CM

## 2023-06-04 DIAGNOSIS — H6002 Abscess of left external ear: Secondary | ICD-10-CM | POA: Diagnosis not present

## 2023-06-04 DIAGNOSIS — H9202 Otalgia, left ear: Secondary | ICD-10-CM | POA: Diagnosis present

## 2023-06-04 LAB — GROUP A STREP BY PCR: Group A Strep by PCR: NOT DETECTED

## 2023-06-04 MED ORDER — LIDOCAINE HCL (PF) 1 % IJ SOLN
5.0000 mL | Freq: Once | INTRAMUSCULAR | Status: AC
  Administered 2023-06-04: 5 mL
  Filled 2023-06-04: qty 30

## 2023-06-04 MED ORDER — ONDANSETRON HCL 4 MG PO TABS: 4.0000 mg | ORAL_TABLET | Freq: Four times a day (QID) | ORAL | 0 refills | Status: DC

## 2023-06-04 MED ORDER — CEPHALEXIN 500 MG PO CAPS: 500.0000 mg | ORAL_CAPSULE | Freq: Four times a day (QID) | ORAL | 0 refills | Status: AC

## 2023-06-04 MED ORDER — TETANUS-DIPHTH-ACELL PERTUSSIS 5-2.5-18.5 LF-MCG/0.5 IM SUSY
0.5000 mL | PREFILLED_SYRINGE | Freq: Once | INTRAMUSCULAR | Status: AC
  Administered 2023-06-04: 0.5 mL via INTRAMUSCULAR
  Filled 2023-06-04: qty 0.5

## 2023-06-04 NOTE — ED Provider Notes (Signed)
 [place] [facility] AT [facility] Provider Note   CSN: [id number] Arrival date & time: 06/04/23  4098     History  Chief Complaint  Patient presents with   Otalgia    Dustin Day is a 33 y.o. male.   Otalgia Patient is a 33 year old male presents ED today with complaints of left ear pain and swelling over the lobe, noting it being present x 2 days.  Reports painful, swollen preauricular lymph node and sore throat as well.  Denies any trauma to the area and reports that he is not wearing any jewelry in that left ear in approximately 1 month.  Does not have a previous history of abscesses.  Denies fever, headache, vertigo, visual changes, hearing changes, congestion, cough, chest pain, nausea, vomiting, dysuria.     Home Medications Prior to Admission medications   Medication Sig Start Date End Date Taking? Authorizing Provider  cephALEXin (KEFLEX) 500 MG capsule Take 1 capsule (500 mg total) by mouth 4 (four) times daily. 06/04/23  Yes Veatrice Georgis S, PA-C  ondansetron (ZOFRAN) 4 MG tablet Take 1 tablet (4 mg total) by mouth every 6 (six) hours. 06/04/23  Yes Colt Martelle S, PA-C  albuterol  (PROVENTIL  HFA;VENTOLIN  HFA) 108 (90 Base) MCG/ACT inhaler Inhale 1-2 puffs into the lungs every 6 (six) hours as needed for wheezing or shortness of breath. 11/05/17   Theodora Fish, PA-C  amoxicillin  (AMOXIL ) 500 MG capsule Take 1 capsule (500 mg total) by mouth 3 (three) times daily. 04/28/21   Sofia, Leslie K, PA-C  naproxen  (NAPROSYN ) 375 MG tablet Take 1 tablet (375 mg total) by mouth 2 (two) times daily. 05/06/15   Palumbo, April, MD  Oxycodone  HCl 10 MG TABS Take 10 mg by mouth every 8 (eight) hours as needed.  05/21/15   [provider]      Allergies    Vicodin [hydrocodone -acetaminophen ]    Review of Systems   Review of Systems  HENT:  Positive for ear pain.   All other systems reviewed and are negative.   Physical Exam Updated Vital  Signs BP (!) 146/88 (BP Location: Left Arm)   Pulse 77   Resp 16   Ht 6' (1.829 m)   Wt 108.9 kg   SpO2 99%   BMI 32.56 kg/m  Physical Exam Vitals and nursing note reviewed.  Constitutional:      General: He is not in acute distress.    Appearance: Normal appearance. He is not ill-appearing or diaphoretic.  HENT:     Head: Normocephalic and atraumatic.     Right Ear: Tympanic membrane, ear canal and external ear normal.     Left Ear: Tympanic membrane, ear canal and external ear normal.     Nose: Nose normal. No congestion.     Mouth/Throat:     Mouth: Mucous membranes are moist.     Pharynx: Oropharynx is clear. No oropharyngeal exudate or posterior oropharyngeal erythema.     Comments: Swollen tonsils 2+ bilaterally Eyes:     General: No scleral icterus.       Right eye: No discharge.        Left eye: No discharge.     Extraocular Movements: Extraocular movements intact.     Conjunctiva/sclera: Conjunctivae normal.  Cardiovascular:     Rate and Rhythm: Normal rate and regular rhythm.     Pulses: Normal pulses.     Heart sounds: Normal heart sounds. No murmur heard.    No friction  rub. No gallop.  Pulmonary:     Effort: Pulmonary effort is normal. No respiratory distress.     Breath sounds: Normal breath sounds. No stridor. No wheezing, rhonchi or rales.  Chest:     Chest wall: No tenderness.  Abdominal:     General: Abdomen is flat. There is no distension.     Palpations: Abdomen is soft.     Tenderness: There is no abdominal tenderness. There is no guarding.  Musculoskeletal:        General: No deformity.     Cervical back: Normal range of motion and neck supple. No rigidity or tenderness.     Right lower leg: No edema.     Left lower leg: No edema.  Lymphadenopathy:     Cervical: Cervical adenopathy (Left-sided preauricular swollen lymph node, tender to palpation) present.  Skin:    General: Skin is warm and dry.     Findings: Erythema (Erythema noted over the  left earlobe with a 1.5 x 1.5 cm area of fluctuance) present. No bruising.  Neurological:     General: No focal deficit present.     Mental Status: He is alert and oriented to person, place, and time. Mental status is at baseline.     Motor: No weakness.  Psychiatric:        Mood and Affect: Mood normal.     ED Results / Procedures / Treatments   Labs (all labs ordered are listed, but only abnormal results are displayed) Labs Reviewed  GROUP A STREP BY PCR    EKG None  Radiology No results found.  Procedures .Incision and Drainage  Date/Time: 06/04/2023 9:23 AM  Performed by: Hayes Lipps, PA-C Authorized by: Hayes Lipps, PA-C   Consent:    Consent obtained:  Verbal   Consent given by:  Patient   Risks, benefits, and alternatives were discussed: yes     Risks discussed:  Bleeding, incomplete drainage, pain, infection and damage to other organs   Alternatives discussed:  No treatment and delayed treatment Universal protocol:    Procedure explained and questions answered to patient or proxy's satisfaction: yes     Relevant documents present and verified: yes     Patient identity confirmed:  Verbally with patient and arm band Location:    Type:  Abscess   Size:  1.5 x 1.5 cm   Location:  Head   Head location:  L external ear Pre-procedure details:    Skin preparation:  Chlorhexidine with alcohol and povidone-iodine Sedation:    Sedation type:  None Anesthesia:    Anesthesia method:  Local infiltration   Local anesthetic:  Lidocaine 1% w/o epi Procedure type:    Complexity:  Simple Procedure details:    Ultrasound guidance: no     Needle aspiration: yes     Needle size:  22 G   Incision types:  Single straight   Wound management:  Probed and deloculated and irrigated with saline   Drainage:  Bloody and purulent   Drainage amount:  Moderate   Wound treatment:  Wound left open   Packing materials:  None Post-procedure details:    Procedure completion:   Tolerated well, no immediate complications     Medications Ordered in ED Medications  lidocaine (PF) (XYLOCAINE) 1 % injection 5 mL (has no administration in time range)  Tdap (BOOSTRIX) injection 0.5 mL (0.5 mLs Intramuscular Given 06/04/23 0815)    ED Course/ Medical Decision Making/ A&P  Medical Decision Making  This patient is a 33 year old male who presents to the ED for concern of abscess to left earlobe present x 2 days.  No previous history of abscesses, also complaining of swollen preauricular lymph node.  On physical exam, patient is in no acute distress, afebrile, alert and orient x 4, speaking in full sentences, nontachypneic, nontachycardic.  Noted to have a 1.5 x 1.5 cm area of fluctuance to the left earlobe.  Skin is mildly erythematous.  Also noted to have a swollen preauricular lymph node to the left side.  Oropharynx notes 2+ swollen tonsils bilaterally.  Exam is otherwise unremarkable  With patient's sore throat and swollen tonsils, strep test was done and negative.  Suspect abscess versus cellulitis.  Area was cleaned appropriately and I&D , with purulent fluid removed.  Will have him continue to do warm compresses and be placed on antibiotic.  Will have him follow-up with PCP for recheck and return to the ED for any new or worsening symptoms.  Patient vital signs have remained stable throughout the course of patient's time in the ED. Low suspicion for any other emergent pathology at this time. I believe this patient is safe to be discharged. Provided strict return to ER precautions. Patient expressed agreement and understanding of plan. All questions were answered.  Differential diagnoses prior to evaluation: The emergent differential diagnosis includes, but is not limited to, abscess, cellulitis, dermatitis, folliculitis. This is not an exhaustive differential.   Past Medical History / Co-morbidities / Social History: Anxiety,  depression  Additional history: Chart reviewed.   Lab Tests/Imaging studies: I personally interpreted labs/imaging and the pertinent results include: Strep test negative.     Medications: I ordered medication including Zofran, Keflex.  I have reviewed the patients home medicines and have made adjustments as needed.   Disposition: After consideration of the diagnostic results and the patients response to treatment, I feel that the patient would benefit from discharge and treatment as above.   emergency department workup does not suggest an emergent condition requiring admission or immediate intervention beyond what has been performed at this time. The plan is: Keflex to help prevent recurrence, Zofran as needed for nausea, return to the ED for any new or worsening symptoms, wound management at home. The patient is safe for discharge and has been instructed to return immediately for worsening symptoms, change in symptoms or any other concerns.   Final Clinical Impression(s) / ED Diagnoses Final diagnoses:  Abscess    Rx / DC Orders ED Discharge Orders          Ordered    cephALEXin (KEFLEX) 500 MG capsule  4 times daily        06/04/23 0921    ondansetron (ZOFRAN) 4 MG tablet  Every 6 hours        06/04/23 0921              Hayes Lipps, PA-C 06/04/23 [id number]    Lind Repine, MD 06/04/23 1222

## 2023-06-04 NOTE — ED Triage Notes (Signed)
 Pt reports swelling to left ear lobe starting Saturday, today throat hurts as well.

## 2023-06-04 NOTE — Discharge Instructions (Addendum)
 You were seen today for abscess to left ear.  Strep test was also done today for your sore throat and mildly swollen tonsils but was negative.  I am sending home with antibiotic for you to begin taking for helping prevent reoccurrence of this abscess.  Take this for the next 5 days, 4 times daily.  Return to the ED if you begin to have any fever, increased pain despite Tylenol  and ibuprofen , increased swelling.  This wound will continue to drain over the course of the next couple of days.  However even with this procedure done today, recurrence and infection can still happen.  I am also sending in nausea medication for you to pick up if needed for the antibiotics.  The antibiotics can cause stomach upset, take with food to help avoid this.

## 2023-11-14 ENCOUNTER — Encounter: Payer: Self-pay | Admitting: Emergency Medicine

## 2023-11-14 ENCOUNTER — Ambulatory Visit: Admission: EM | Admit: 2023-11-14 | Discharge: 2023-11-14 | Disposition: A | Discharge status: Home / Self Care

## 2023-11-14 DIAGNOSIS — J069 Acute upper respiratory infection, unspecified: Secondary | ICD-10-CM | POA: Diagnosis not present

## 2023-11-14 DIAGNOSIS — Z720 Tobacco use: Secondary | ICD-10-CM | POA: Insufficient documentation

## 2023-11-14 DIAGNOSIS — E66812 Obesity, class 2: Secondary | ICD-10-CM | POA: Insufficient documentation

## 2023-11-14 DIAGNOSIS — Z8669 Personal history of other diseases of the nervous system and sense organs: Secondary | ICD-10-CM | POA: Insufficient documentation

## 2023-11-14 DIAGNOSIS — E6609 Other obesity due to excess calories: Secondary | ICD-10-CM | POA: Insufficient documentation

## 2023-11-14 DIAGNOSIS — R7303 Prediabetes: Secondary | ICD-10-CM | POA: Insufficient documentation

## 2023-11-14 DIAGNOSIS — E782 Mixed hyperlipidemia: Secondary | ICD-10-CM | POA: Insufficient documentation

## 2023-11-14 DIAGNOSIS — Z6835 Body mass index (BMI) 35.0-35.9, adult: Secondary | ICD-10-CM | POA: Insufficient documentation

## 2023-11-14 LAB — POC COVID19/FLU A&B COMBO
Covid Antigen, POC: NEGATIVE
Influenza A Antigen, POC: NEGATIVE
Influenza B Antigen, POC: NEGATIVE

## 2023-11-14 NOTE — Discharge Instructions (Signed)

## 2023-11-14 NOTE — ED Triage Notes (Signed)
 Pt presents c/o URI x 3 days. Pt states,  Throat pain. Ear pain. Shortness of breath. Had the sxs since Saturday. And I had a fever for like 3 days.  Pt denies emesis but does c/o diarrhea.
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
## 2023-11-22 ENCOUNTER — Other Ambulatory Visit: Payer: Self-pay

## 2023-11-22 ENCOUNTER — Emergency Department (HOSPITAL_COMMUNITY)
Admission: EM | Admit: 2023-11-22 | Discharge: 2023-11-22 | Disposition: A | Discharge status: Home / Self Care | Attending: Emergency Medicine | Admitting: Emergency Medicine

## 2023-11-22 ENCOUNTER — Encounter (HOSPITAL_COMMUNITY): Payer: Self-pay

## 2023-11-22 ENCOUNTER — Emergency Department (HOSPITAL_COMMUNITY)

## 2023-11-22 DIAGNOSIS — R1013 Epigastric pain: Secondary | ICD-10-CM | POA: Insufficient documentation

## 2023-11-22 DIAGNOSIS — D72829 Elevated white blood cell count, unspecified: Secondary | ICD-10-CM | POA: Diagnosis not present

## 2023-11-22 DIAGNOSIS — R1011 Right upper quadrant pain: Secondary | ICD-10-CM | POA: Insufficient documentation

## 2023-11-22 DIAGNOSIS — R197 Diarrhea, unspecified: Secondary | ICD-10-CM | POA: Diagnosis not present

## 2023-11-22 DIAGNOSIS — R109 Unspecified abdominal pain: Secondary | ICD-10-CM | POA: Diagnosis not present

## 2023-11-22 DIAGNOSIS — R059 Cough, unspecified: Secondary | ICD-10-CM | POA: Diagnosis not present

## 2023-11-22 DIAGNOSIS — R1084 Generalized abdominal pain: Secondary | ICD-10-CM | POA: Insufficient documentation

## 2023-11-22 DIAGNOSIS — K760 Fatty (change of) liver, not elsewhere classified: Secondary | ICD-10-CM | POA: Diagnosis not present

## 2023-11-22 DIAGNOSIS — R1031 Right lower quadrant pain: Secondary | ICD-10-CM | POA: Insufficient documentation

## 2023-11-22 LAB — URINALYSIS, ROUTINE W REFLEX MICROSCOPIC
Bilirubin Urine: NEGATIVE
Glucose, UA: NEGATIVE mg/dL
Ketones, ur: NEGATIVE mg/dL
Nitrite: NEGATIVE
Protein, ur: NEGATIVE mg/dL
Specific Gravity, Urine: 1.006 (ref 1.005–1.030)
pH: 5 (ref 5.0–8.0)

## 2023-11-22 LAB — CBC
HCT: 44.8 % (ref 39.0–52.0)
Hemoglobin: 15.4 g/dL (ref 13.0–17.0)
MCH: 30.1 pg (ref 26.0–34.0)
MCHC: 34.4 g/dL (ref 30.0–36.0)
MCV: 87.5 fL (ref 80.0–100.0)
Platelets: 717 K/uL — ABNORMAL HIGH (ref 150–400)
RBC: 5.12 MIL/uL (ref 4.22–5.81)
RDW: 13.1 % (ref 11.5–15.5)
WBC: 15.6 K/uL — ABNORMAL HIGH (ref 4.0–10.5)
nRBC: 0 % (ref 0.0–0.2)

## 2023-11-22 LAB — COMPREHENSIVE METABOLIC PANEL WITH GFR
ALT: 25 U/L (ref 0–44)
AST: 28 U/L (ref 15–41)
Albumin: 4.1 g/dL (ref 3.5–5.0)
Alkaline Phosphatase: 107 U/L (ref 38–126)
Anion gap: 14 (ref 5–15)
BUN: 6 mg/dL (ref 6–20)
CO2: 26 mmol/L (ref 22–32)
Calcium: 9.9 mg/dL (ref 8.9–10.3)
Chloride: 99 mmol/L (ref 98–111)
Creatinine, Ser: 0.7 mg/dL (ref 0.61–1.24)
GFR, Estimated: >60 mL/min (ref >60)
Glucose, Bld: 104 mg/dL — ABNORMAL HIGH (ref 70–99)
Potassium: 3.7 mmol/L (ref 3.5–5.1)
Sodium: 138 mmol/L (ref 135–145)
Total Bilirubin: 0.4 mg/dL (ref 0.0–1.2)
Total Protein: 9.6 g/dL — ABNORMAL HIGH (ref 6.5–8.1)

## 2023-11-22 LAB — RESP PANEL BY RT-PCR (RSV, FLU A&B, COVID)  RVPGX2
Influenza A by PCR: NEGATIVE
Influenza B by PCR: NEGATIVE
Resp Syncytial Virus by PCR: NEGATIVE
SARS Coronavirus 2 by RT PCR: NEGATIVE

## 2023-11-22 LAB — LIPASE, BLOOD: Lipase: 98 U/L — ABNORMAL HIGH (ref 11–51)

## 2023-11-22 MED ORDER — IOHEXOL 300 MG/ML  SOLN
100.0000 mL | Freq: Once | INTRAMUSCULAR | Status: AC | PRN
  Administered 2023-11-22: 100 mL via INTRAVENOUS

## 2023-11-22 MED ORDER — SODIUM CHLORIDE 0.9 % IV BOLUS
1000.0000 mL | Freq: Once | INTRAVENOUS | Status: AC
  Administered 2023-11-22: 1000 mL via INTRAVENOUS

## 2023-11-22 MED ORDER — SUCRALFATE 1 G PO TABS: 1.0000 g | ORAL_TABLET | Freq: Three times a day (TID) | ORAL | 0 refills | Status: DC

## 2023-11-22 MED ORDER — PANTOPRAZOLE SODIUM 20 MG PO TBEC: 20.0000 mg | DELAYED_RELEASE_TABLET | Freq: Every day | ORAL | 0 refills | Status: DC

## 2023-11-22 MED ORDER — FAMOTIDINE IN NACL 20-0.9 MG/50ML-% IV SOLN
20.0000 mg | Freq: Once | INTRAVENOUS | Status: AC
  Administered 2023-11-22: 20 mg via INTRAVENOUS
  Filled 2023-11-22: qty 50

## 2023-11-22 MED ORDER — HYDROMORPHONE HCL 1 MG/ML IJ SOLN
0.5000 mg | Freq: Once | INTRAMUSCULAR | Status: AC
  Administered 2023-11-22: 0.5 mg via INTRAVENOUS
  Filled 2023-11-22: qty 1

## 2023-11-22 MED ORDER — ONDANSETRON HCL 4 MG/2ML IJ SOLN
4.0000 mg | Freq: Once | INTRAMUSCULAR | Status: AC
  Administered 2023-11-22: 4 mg via INTRAVENOUS
  Filled 2023-11-22: qty 2

## 2023-11-22 MED ORDER — ALUM & MAG HYDROXIDE-SIMETH 200-200-20 MG/5ML PO SUSP
30.0000 mL | Freq: Once | ORAL | Status: AC
  Administered 2023-11-22: 30 mL via ORAL
  Filled 2023-11-22: qty 30

## 2023-11-22 MED ORDER — LIDOCAINE VISCOUS HCL 2 % MT SOLN
15.0000 mL | Freq: Once | OROMUCOSAL | Status: AC
  Administered 2023-11-22: 15 mL via ORAL
  Filled 2023-11-22: qty 15

## 2023-11-22 MED ORDER — MORPHINE SULFATE (PF) 4 MG/ML IV SOLN
4.0000 mg | Freq: Once | INTRAVENOUS | Status: AC
  Administered 2023-11-22: 4 mg via INTRAVENOUS
  Filled 2023-11-22: qty 1

## 2023-11-22 NOTE — ED Provider Notes (Signed)
 [place] EMERGENCY DEPARTMENT AT [facility] Provider Note   CSN: [id number] Arrival date & time: 11/22/23  1129     Patient presents with: Abdominal Pain   Dustin Day is a 33 y.o. male.  Patient is a 33 year old male with a history of chronic back pain, retinal detachment, and hyperlipidemia who presents to the ED for increasing abdominal pain for the past 4 days.  He also notes occasional associated diarrhea.  He states he had a upper respiratory infection last week with cough/congestion but that has improved.  He states once the symptoms stopped approximately 4 days ago, he began to have the abdominal pain.  Notes pain has continued to get worse.  Denies nausea/vomiting.  No previous abdominal surgeries.  He does note that he was taking quite a bit of ibuprofen  while being sick last week.  He does note alcohol use approximately 2 times a week and marijuana use but no IV drug use.  Denies fevers, chills, headache, dizziness, chest pain, shortness of breath, dysuria.  No further complaints.  Abdominal Pain Associated symptoms: diarrhea   Associated symptoms: no chest pain, no chills, no dysuria, no fever, no nausea, no shortness of breath and no vomiting        Prior to Admission medications   Medication Sig Start Date End Date Taking? Authorizing Provider  pantoprazole (PROTONIX) 20 MG tablet Take 1 tablet (20 mg total) by mouth daily. 11/22/23 12/22/23 Yes Taniqua Issa, Thersia RAMAN, PA-C  sucralfate (CARAFATE) 1 g tablet Take 1 tablet (1 g total) by mouth 4 (four) times daily -  with meals and at bedtime for 5 days. 11/22/23 11/27/23 Yes Eagan Shifflett, Thersia RAMAN, PA-C  albuterol  (PROVENTIL  HFA;VENTOLIN  HFA) 108 (90 Base) MCG/ACT inhaler Inhale 1-2 puffs into the lungs every 6 (six) hours as needed for wheezing or shortness of breath. 11/05/17   Norris Will PARAS, PA-C  amoxicillin  (AMOXIL ) 500 MG capsule Take 1 capsule (500 mg total) by mouth 3 (three) times daily. 04/28/21   Sofia,  Leslie K, PA-C  brimonidine (ALPHAGAN) 0.2 % ophthalmic solution Apply 1 drop to eye. 04/07/21   [provider]  cephALEXin  (KEFLEX ) 500 MG capsule Take 1 capsule (500 mg total) by mouth 4 (four) times daily. 06/04/23   Beola Terrall RAMAN, PA-C  naproxen  (NAPROSYN ) 375 MG tablet Take 1 tablet (375 mg total) by mouth 2 (two) times daily. 05/06/15   Palumbo, April, MD  ondansetron  (ZOFRAN ) 4 MG tablet Take 1 tablet (4 mg total) by mouth every 6 (six) hours. 06/04/23   Bauer, Collin S, PA-C  Oxycodone  HCl 10 MG TABS Take 10 mg by mouth every 8 (eight) hours as needed.  05/21/15   [provider]    Allergies: Vicodin [hydrocodone -acetaminophen ]    Review of Systems  Constitutional:  Negative for chills and fever.  HENT:  Negative for congestion, ear pain, tinnitus and trouble swallowing.   Respiratory:  Negative for shortness of breath.   Cardiovascular:  Negative for chest pain.  Gastrointestinal:  Positive for abdominal pain and diarrhea. Negative for nausea and vomiting.  Genitourinary:  Negative for dysuria.  Neurological:  Negative for dizziness, syncope and headaches.  All other systems reviewed and are negative.   Updated Vital Signs BP 136/82   Pulse 70   Temp 98.1 F (36.7 C)   Resp 16   SpO2 100%   Physical Exam Constitutional:      Appearance: He is well-developed.  HENT:     Head: Normocephalic  and atraumatic.     Mouth/Throat:     Mouth: Mucous membranes are moist.     Pharynx: Oropharynx is clear.  Cardiovascular:     Rate and Rhythm: Normal rate.  Pulmonary:     Effort: Pulmonary effort is normal.  Abdominal:     General: Bowel sounds are normal.     Tenderness: There is abdominal tenderness in the right upper quadrant, right lower quadrant and epigastric area.  Skin:    General: Skin is warm and dry.  Neurological:     Mental Status: He is alert and oriented to person, place, and time.  Psychiatric:        Mood and Affect: Mood normal.         Behavior: Behavior normal.     (all labs ordered are listed, but only abnormal results are displayed) Labs Reviewed  LIPASE, BLOOD - Abnormal; Notable for the following components:      Result Value   Lipase 98 (*)    All other components within normal limits  COMPREHENSIVE METABOLIC PANEL WITH GFR - Abnormal; Notable for the following components:   Glucose, Bld 104 (*)    Total Protein 9.6 (*)    All other components within normal limits  CBC - Abnormal; Notable for the following components:   WBC 15.6 (*)    Platelets 717 (*)    All other components within normal limits  URINALYSIS, ROUTINE W REFLEX MICROSCOPIC - Abnormal; Notable for the following components:   APPearance HAZY (*)    Hgb urine dipstick SMALL (*)    Leukocytes,Ua SMALL (*)    Bacteria, UA RARE (*)    All other components within normal limits  RESP PANEL BY RT-PCR (RSV, FLU A&B, COVID)  RVPGX2    EKG: None  Radiology: CT ABDOMEN PELVIS W CONTRAST Result Date: 11/22/2023 CLINICAL DATA:  Acute abdominal pain EXAM: CT ABDOMEN AND PELVIS WITH CONTRAST TECHNIQUE: Multidetector CT imaging of the abdomen and pelvis was performed using the standard protocol following bolus administration of intravenous contrast. RADIATION DOSE REDUCTION: This exam was performed according to the departmental dose-optimization program which includes automated exposure control, adjustment of the mA and/or kV according to patient size and/or use of iterative reconstruction technique. CONTRAST:  100mL OMNIPAQUE IOHEXOL 300 MG/ML  SOLN COMPARISON:  None Available. FINDINGS: Lower chest: No acute abnormality. Hepatobiliary: No focal liver abnormality is seen. No gallstones, gallbladder wall thickening, or biliary dilatation. There is diffuse fatty infiltration of the liver. Pancreas: Unremarkable. No pancreatic ductal dilatation or surrounding inflammatory changes. Spleen: Normal in size without focal abnormality. Adrenals/Urinary Tract: Adrenal  glands are unremarkable. Kidneys are normal, without renal calculi, focal lesion, or hydronephrosis. Bladder is unremarkable. Stomach/Bowel: Stomach is within normal limits. Appendix appears normal. No evidence of bowel wall thickening, distention, or inflammatory changes. Vascular/Lymphatic: No significant vascular findings are present. No enlarged abdominal or pelvic lymph nodes. Reproductive: Prostate is unremarkable. Other: No abdominal wall hernia or abnormality. No abdominopelvic ascites. Musculoskeletal: No fracture is seen. IMPRESSION: 1. No acute localizing process in the abdomen or pelvis. 2. Hepatic steatosis. Electronically Signed   By: Greig Pique M.D.   On: 11/22/2023 16:12      Medications Ordered in the ED  sodium chloride  0.9 % bolus 1,000 mL (0 mLs Intravenous Stopped 11/22/23 1651)  morphine  (PF) 4 MG/ML injection 4 mg (4 mg Intravenous Given 11/22/23 1454)  ondansetron  (ZOFRAN ) injection 4 mg (4 mg Intravenous Given 11/22/23 1454)  famotidine (PEPCID) IVPB 20 mg  premix (0 mg Intravenous Stopped 11/22/23 1643)  iohexol (OMNIPAQUE) 300 MG/ML solution 100 mL (100 mLs Intravenous Contrast Given 11/22/23 1543)  alum & mag hydroxide-simeth (MAALOX/MYLANTA) 200-200-20 MG/5ML suspension 30 mL (30 mLs Oral Given 11/22/23 1649)    And  lidocaine  (XYLOCAINE ) 2 % viscous mouth solution 15 mL (15 mLs Oral Given 11/22/23 1649)  HYDROmorphone (DILAUDID) injection 0.5 mg (0.5 mg Intravenous Given 11/22/23 1648)                                   Medical Decision Making Amount and/or Complexity of Data Reviewed Labs: ordered. Radiology: ordered.  Risk OTC drugs. Prescription drug management.    Patient is a 33 year old male with history of chronic back pain, retinal detachment, hyperlipidemia who presents to the ED for diarrhea and lower abdominal pain for the past 4 to 5 days.  Please see detailed HPI above.  On exam patient is alert and in no acute distress.  Physical exam as  noted above.  He is noted to have generalized abdominal pain that is worse in the bilateral lower quadrants.  Labs significant for mild leukocytosis of 15.6 and lipase of 98.  Viral swabs negative.  CT of the abdomen pelvis is negative for acute abnormality.  Differential includes acute viral illness, gastritis, GERD, pancreatitis, appendicitis, cholecystitis.  Patient given morphine , Zofran , Dilaudid, Pepcid, and GI cocktail while in the ED with some improvement in pain.  He is tolerating oral intake well.  Suspect patient may have mild pancreatitis versus gastritis secondary to ibuprofen  use last week.  No concerns for further emergent workup at this time.  Stable for discharge home, vital signs stable.  Prescribed Carafate and Protonix.  Symptomatic care discussed.  Referred to GI for further evaluation and management of continued abdominal pain as needed.  Return precautions provided for worsening symptoms.  Case discussed with attending physician who agrees with assessment, plan, discharge today.   Final diagnoses:  Generalized abdominal pain  Diarrhea, unspecified type    ED Discharge Orders          Ordered    sucralfate (CARAFATE) 1 g tablet  3 times daily with meals & bedtime        11/22/23 1800    pantoprazole (PROTONIX) 20 MG tablet  Daily        11/22/23 1800    Ambulatory referral to Gastroenterology        11/22/23 1802               Neysa Thersia RAMAN, PA-C 11/22/23 1819    Garrick Charleston, MD 11/23/23 2230

## 2023-11-22 NOTE — Discharge Instructions (Signed)
 Begin taking Carafate and Protonix as prescribed.  Stay hydrated and drink plenty of fluids.  Keep diet bland while experiencing symptoms.  Please follow-up with GI doctor for further evaluation and management of continued abdominal pain as needed.  Return to ED sooner if any symptoms worsen including new fevers, uncontrollable nausea/vomiting/diarrhea, worsening severe abdominal pain.

## 2023-11-22 NOTE — ED Triage Notes (Signed)
 Pt arrives via POV. PT reports ongoing abdominal pain since last week. States he had RSV and could have had the flu. Pt denies n/v at this time, does report some diarrhea. Pt AxOx4.

## 2024-01-12 ENCOUNTER — Other Ambulatory Visit: Payer: Self-pay

## 2024-01-12 ENCOUNTER — Emergency Department (HOSPITAL_BASED_OUTPATIENT_CLINIC_OR_DEPARTMENT_OTHER)
Admission: EM | Admit: 2024-01-12 | Discharge: 2024-01-12 | Disposition: A | Discharge status: Home / Self Care | Source: Home / Self Care | Attending: Emergency Medicine | Admitting: Emergency Medicine

## 2024-01-12 ENCOUNTER — Emergency Department (HOSPITAL_BASED_OUTPATIENT_CLINIC_OR_DEPARTMENT_OTHER)

## 2024-01-12 ENCOUNTER — Encounter (HOSPITAL_BASED_OUTPATIENT_CLINIC_OR_DEPARTMENT_OTHER): Payer: Self-pay

## 2024-01-12 ENCOUNTER — Emergency Department (HOSPITAL_BASED_OUTPATIENT_CLINIC_OR_DEPARTMENT_OTHER): Admitting: Radiology

## 2024-01-12 DIAGNOSIS — R059 Cough, unspecified: Secondary | ICD-10-CM | POA: Insufficient documentation

## 2024-01-12 DIAGNOSIS — R112 Nausea with vomiting, unspecified: Secondary | ICD-10-CM

## 2024-01-12 DIAGNOSIS — R1110 Vomiting, unspecified: Secondary | ICD-10-CM | POA: Insufficient documentation

## 2024-01-12 DIAGNOSIS — R509 Fever, unspecified: Secondary | ICD-10-CM | POA: Insufficient documentation

## 2024-01-12 DIAGNOSIS — R109 Unspecified abdominal pain: Secondary | ICD-10-CM | POA: Diagnosis present

## 2024-01-12 LAB — CBC
HCT: 46.4 % (ref 39.0–52.0)
Hemoglobin: 15.7 g/dL (ref 13.0–17.0)
MCH: 30.3 pg (ref 26.0–34.0)
MCHC: 33.8 g/dL (ref 30.0–36.0)
MCV: 89.4 fL (ref 80.0–100.0)
Platelets: 549 K/uL — ABNORMAL HIGH (ref 150–400)
RBC: 5.19 MIL/uL (ref 4.22–5.81)
RDW: 14.2 % (ref 11.5–15.5)
WBC: 17.6 K/uL — ABNORMAL HIGH (ref 4.0–10.5)
nRBC: 0 % (ref 0.0–0.2)

## 2024-01-12 LAB — COMPREHENSIVE METABOLIC PANEL WITH GFR
ALT: 11 U/L (ref 0–44)
AST: 20 U/L (ref 15–41)
Albumin: 4.5 g/dL (ref 3.5–5.0)
Alkaline Phosphatase: 93 U/L (ref 38–126)
Anion gap: 14 (ref 5–15)
BUN: 10 mg/dL (ref 6–20)
CO2: 24 mmol/L (ref 22–32)
Calcium: 9.9 mg/dL (ref 8.9–10.3)
Chloride: 101 mmol/L (ref 98–111)
Creatinine, Ser: 0.67 mg/dL (ref 0.61–1.24)
GFR, Estimated: >60 mL/min
Glucose, Bld: 104 mg/dL — ABNORMAL HIGH (ref 70–99)
Potassium: 3.6 mmol/L (ref 3.5–5.1)
Sodium: 139 mmol/L (ref 135–145)
Total Bilirubin: 0.9 mg/dL (ref 0.0–1.2)
Total Protein: 9.1 g/dL — ABNORMAL HIGH (ref 6.5–8.1)

## 2024-01-12 LAB — RESP PANEL BY RT-PCR (RSV, FLU A&B, COVID)  RVPGX2
Influenza A by PCR: NEGATIVE
Influenza B by PCR: NEGATIVE
Resp Syncytial Virus by PCR: NEGATIVE
SARS Coronavirus 2 by RT PCR: NEGATIVE

## 2024-01-12 LAB — LIPASE, BLOOD: Lipase: 24 U/L (ref 11–51)

## 2024-01-12 MED ORDER — PANTOPRAZOLE SODIUM 20 MG PO TBEC: 20.0000 mg | DELAYED_RELEASE_TABLET | Freq: Every day | ORAL | 0 refills | Status: AC

## 2024-01-12 MED ORDER — SUCRALFATE 1 G PO TABS: 1.0000 g | ORAL_TABLET | Freq: Three times a day (TID) | ORAL | 0 refills | Status: AC

## 2024-01-12 MED ORDER — IOHEXOL 350 MG/ML SOLN
100.0000 mL | Freq: Once | INTRAVENOUS | Status: AC | PRN
  Administered 2024-01-12: 100 mL via INTRAVENOUS

## 2024-01-12 MED ORDER — ALUM & MAG HYDROXIDE-SIMETH 200-200-20 MG/5ML PO SUSP
30.0000 mL | Freq: Once | ORAL | Status: AC
  Administered 2024-01-12: 30 mL via ORAL
  Filled 2024-01-12: qty 30

## 2024-01-12 MED ORDER — ONDANSETRON HCL 4 MG PO TABS: 4.0000 mg | ORAL_TABLET | Freq: Four times a day (QID) | ORAL | 0 refills | Status: AC

## 2024-01-12 MED ORDER — PANTOPRAZOLE SODIUM 40 MG PO TBEC
40.0000 mg | DELAYED_RELEASE_TABLET | Freq: Once | ORAL | Status: AC
  Administered 2024-01-12: 40 mg via ORAL
  Filled 2024-01-12: qty 1

## 2024-01-12 NOTE — ED Provider Notes (Signed)
 " [place] EMERGENCY DEPARTMENT AT [facility] Provider Note   CSN: [id number] Arrival date & time: 01/12/24  1628     Patient presents with: Cough, Fever, and Emesis   Dustin Day is a 34 y.o. male.   Patient here with abdominal pain and vomiting.  He has some blood in his vomit but only 1 or 2 episodes.  He has not had any black or tarry stools.  Admits alcohol use marijuana use.  Denies any chest pain.  Has felt some fever chills.  He has had issues like this before but has not followed up with GI.  He is no longer taking any gastritis medicines.  He is not having any chest pain shortness of breath weakness numbness tingling.  He is feeling better after he got some medicine while in triage.  Has been able to drink Gatorade.  The history is provided by the patient.       Prior to Admission medications  Medication Sig Start Date End Date Taking? Authorizing Provider  albuterol  (PROVENTIL  HFA;VENTOLIN  HFA) 108 (90 Base) MCG/ACT inhaler Inhale 1-2 puffs into the lungs every 6 (six) hours as needed for wheezing or shortness of breath. 11/05/17   Norris Will PARAS, PA-C  amoxicillin  (AMOXIL ) 500 MG capsule Take 1 capsule (500 mg total) by mouth 3 (three) times daily. 04/28/21   Sofia, Leslie K, PA-C  brimonidine (ALPHAGAN) 0.2 % ophthalmic solution Apply 1 drop to eye. 04/07/21   [provider]  cephALEXin  (KEFLEX ) 500 MG capsule Take 1 capsule (500 mg total) by mouth 4 (four) times daily. 06/04/23   Beola Terrall RAMAN, PA-C  naproxen  (NAPROSYN ) 375 MG tablet Take 1 tablet (375 mg total) by mouth 2 (two) times daily. 05/06/15   Palumbo, April, MD  ondansetron  (ZOFRAN ) 4 MG tablet Take 1 tablet (4 mg total) by mouth every 6 (six) hours. 06/04/23   Beola Terrall RAMAN, PA-C  Oxycodone  HCl 10 MG TABS Take 10 mg by mouth every 8 (eight) hours as needed.  05/21/15   [provider]  pantoprazole  (PROTONIX ) 20 MG tablet Take 1 tablet (20 mg total) by mouth daily. 11/22/23  12/22/23  Neysa Thersia RAMAN, PA-C  sucralfate  (CARAFATE ) 1 g tablet Take 1 tablet (1 g total) by mouth 4 (four) times daily -  with meals and at bedtime for 5 days. 11/22/23 11/27/23  Neysa Thersia RAMAN, PA-C    Allergies: Vicodin [hydrocodone -acetaminophen ]    Review of Systems  Updated Vital Signs BP (!) 132/93   Pulse 100   Temp 98.4 F (36.9 C)   Resp 18   SpO2 98%   Physical Exam Vitals and nursing note reviewed.  Constitutional:      General: He is not in acute distress.    Appearance: He is well-developed. He is not ill-appearing.  HENT:     Head: Normocephalic and atraumatic.     Nose: Nose normal.     Mouth/Throat:     Mouth: Mucous membranes are moist.  Eyes:     Extraocular Movements: Extraocular movements intact.     Conjunctiva/sclera: Conjunctivae normal.     Pupils: Pupils are equal, round, and reactive to light.  Cardiovascular:     Rate and Rhythm: Normal rate and regular rhythm.     Pulses: Normal pulses.     Heart sounds: Normal heart sounds. No murmur heard. Pulmonary:     Effort: Pulmonary effort is normal. No respiratory distress.     Breath sounds: Normal breath  sounds.  Abdominal:     Palpations: Abdomen is soft.     Tenderness: There is no abdominal tenderness.  Musculoskeletal:        General: No swelling. Normal range of motion.     Cervical back: Normal range of motion and neck supple.  Skin:    General: Skin is warm and dry.     Capillary Refill: Capillary refill takes less than 2 seconds.  Neurological:     General: No focal deficit present.     Mental Status: He is alert and oriented to person, place, and time.     Cranial Nerves: No cranial nerve deficit.     Sensory: No sensory deficit.     Motor: No weakness.     Coordination: Coordination normal.  Psychiatric:        Mood and Affect: Mood normal.     (all labs ordered are listed, but only abnormal results are displayed) Labs Reviewed  COMPREHENSIVE METABOLIC PANEL WITH GFR -  Abnormal; Notable for the following components:      Result Value   Glucose, Bld 104 (*)    Total Protein 9.1 (*)    All other components within normal limits  CBC - Abnormal; Notable for the following components:   WBC 17.6 (*)    Platelets 549 (*)    All other components within normal limits  RESP PANEL BY RT-PCR (RSV, FLU A&B, COVID)  RVPGX2  LIPASE, BLOOD  URINALYSIS, ROUTINE W REFLEX MICROSCOPIC    EKG: None  Radiology: CT ABDOMEN PELVIS W CONTRAST Result Date: 01/12/2024 CLINICAL DATA:  Abdominal pain EXAM: CT ABDOMEN AND PELVIS WITH CONTRAST TECHNIQUE: Multidetector CT imaging of the abdomen and pelvis was performed using the standard protocol following bolus administration of intravenous contrast. RADIATION DOSE REDUCTION: This exam was performed according to the departmental dose-optimization program which includes automated exposure control, adjustment of the mA and/or kV according to patient size and/or use of iterative reconstruction technique. CONTRAST:  OMNIPAQUE  IOHEXOL  350 MG/ML SOLN COMPARISON:  CT 11/22/2023 FINDINGS: Lower chest: No acute abnormality. Hepatobiliary: Hepatic steatosis. No calcified gallstone or biliary dilatation Pancreas: Unremarkable. No pancreatic ductal dilatation or surrounding inflammatory changes. Spleen: Normal in size without focal abnormality. Adrenals/Urinary Tract: Adrenal glands are unremarkable. Kidneys are normal, without renal calculi, focal lesion, or hydronephrosis. Bladder is unremarkable. Stomach/Bowel: Stomach is within normal limits. Appendix appears normal. No evidence of bowel wall thickening, distention, or inflammatory changes. Vascular/Lymphatic: No significant vascular findings are present. No enlarged abdominal or pelvic lymph nodes. Reproductive: Prostate is unremarkable. Other: No abdominal wall hernia or abnormality. No abdominopelvic ascites. Musculoskeletal: No acute or significant osseous findings. IMPRESSION: 1. No CT  evidence for acute intra-abdominal or pelvic abnormality. 2. Hepatic steatosis. Electronically Signed   By: Luke Bun M.D.   On: 01/12/2024 18:20   DG Chest 2 View Result Date: 01/12/2024 CLINICAL DATA:  Cough, fever, and emesis. EXAM: DG CHEST 2V COMPARISON:  03/30/2012. FINDINGS: The heart size and mediastinal contours are within normal limits. No consolidation, effusion, or pneumothorax is seen. No acute osseous abnormality. IMPRESSION: No active cardiopulmonary disease. Electronically Signed   By: Leita Birmingham M.D.   On: 01/12/2024 17:32     Procedures   Medications Ordered in the ED  pantoprazole  (PROTONIX ) EC tablet 40 mg (has no administration in time range)  alum & mag hydroxide-simeth (MAALOX/MYLANTA) 200-200-20 MG/5ML suspension 30 mL (has no administration in time range)  iohexol  (OMNIPAQUE ) 350 MG/ML injection 100 mL (100 mLs  Intravenous Contrast Given 01/12/24 1755)                                    Medical Decision Making Amount and/or Complexity of Data Reviewed Labs: ordered. Radiology: ordered.  Risk OTC drugs. Prescription drug management.   Dustin Day is here with abdominal pain and vomiting.  Maybe some blood in the vomit.  But no large-volume hematemesis.  No black or bloody stools.  Does admit to alcohol marijuana use.  Differential diagnosis likely gastritis versus marijuana hyperemesis.  Normal vitals.  No fever.  He is already had blood work done prior to my evaluation including CT scan abdomen pelvis.  He has had no significant leukocytosis anemia or electrolyte abnormality.  COVID flu RSV test negative.  Hemoglobin is 15.7.  He does have mild white count likely from stress reaction.  However CT scan showed no acute findings.  Chest x-ray showed no pneumonia pneumothorax.  He is feeling much better on my evaluation.  He would like to go home which I think is reasonable.  I do think this is likely some stomach inflammation.  Will put him back on Protonix   Carafate  and Zofran  and have him follow-up with GI.  Recommend avoiding alcohol and marijuana ibuprofen .  Told to return if symptoms worsen.  Discharge.  No concern for other emergent process at this time.  This chart was dictated using voice recognition software.  Despite best efforts to proofread,  errors can occur which can change the documentation meaning.      Final diagnoses:  None    ED Discharge Orders     None          Ruthe Cornet, DO 01/12/24 2009  "

## 2024-01-12 NOTE — ED Notes (Signed)
 DC paperwork given and verbally understood.

## 2024-01-12 NOTE — Discharge Instructions (Addendum)
 Take medications as prescribed.  Please abstain from alcohol marijuana.  Return if symptoms worsen as we discussed including black tarry stool or large volumes of vomiting blood

## 2024-01-12 NOTE — ED Triage Notes (Signed)
 Patient arrives with fever, cough, and emesis. He reports throwing up bright red blood for 2 days with 7 episodes of this vomiting. Denies any sick contacts. Reports diarrhea, no blood in this.

## 2024-01-16 ENCOUNTER — Ambulatory Visit: Admitting: Physician Assistant
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
